# Patient Record
Sex: Female | Born: 1990 | Race: Black or African American | Hispanic: No | Marital: Single | Smoking: Former smoker
Health system: Southern US, Community
[De-identification: ages and names within clinical notes are randomized; demographics above are authoritative.]

## PROBLEM LIST (undated history)

## (undated) ENCOUNTER — Inpatient Hospital Stay (HOSPITAL_COMMUNITY): Payer: Self-pay

## (undated) DIAGNOSIS — I10 Essential (primary) hypertension: Secondary | ICD-10-CM

## (undated) DIAGNOSIS — E119 Type 2 diabetes mellitus without complications: Secondary | ICD-10-CM

## (undated) HISTORY — PX: NO PAST SURGERIES: SHX2092

---
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
## 2009-05-11 ENCOUNTER — Other Ambulatory Visit: Admission: RE | Admit: 2009-05-11 | Discharge: 2009-05-11 | Payer: Self-pay | Admitting: Family Medicine

## 2009-07-10 ENCOUNTER — Emergency Department (HOSPITAL_COMMUNITY): Admission: EM | Admit: 2009-07-10 | Discharge: 2009-07-10 | Payer: Self-pay | Admitting: Family Medicine

## 2010-07-15 ENCOUNTER — Emergency Department (HOSPITAL_COMMUNITY): Admission: EM | Admit: 2010-07-15 | Discharge: 2010-07-15 | Payer: Self-pay | Admitting: Family Medicine

## 2012-08-11 ENCOUNTER — Encounter (HOSPITAL_COMMUNITY): Payer: Self-pay | Admitting: Emergency Medicine

## 2012-08-11 ENCOUNTER — Emergency Department (HOSPITAL_COMMUNITY)
Admission: EM | Admit: 2012-08-11 | Discharge: 2012-08-11 | Disposition: A | Discharge status: Home / Self Care | Payer: 59 | Source: Home / Self Care | Attending: Family Medicine | Admitting: Family Medicine

## 2012-08-11 DIAGNOSIS — M25519 Pain in unspecified shoulder: Secondary | ICD-10-CM

## 2012-08-11 MED ORDER — NAPROXEN 500 MG PO TABS: 500.0000 mg | ORAL_TABLET | Freq: Two times a day (BID) | ORAL | Status: DC

## 2012-08-11 MED ORDER — CYCLOBENZAPRINE HCL 10 MG PO TABS: 10.0000 mg | ORAL_TABLET | Freq: Two times a day (BID) | ORAL | Status: DC | PRN

## 2012-08-11 NOTE — Discharge Instructions (Signed)
Shoulder Range of Motion Exercises  The shoulder is the most flexible joint in the human body. Because of this it is also the most unstable joint in the body. All ages can develop shoulder problems. Early treatment of problems is necessary for a good outcome. People react to shoulder pain by decreasing the movement of the joint. After a brief period of time, the shoulder can become "frozen". This is an almost complete loss of the ability to move the damaged shoulder. Following injuries your caregivers can give you instructions on exercises to keep your range of motion (ability to move your shoulder freely), or regain it if it has been lost.   EXERCISES  EXERCISES TO MAINTAIN THE MOBILITY OF YOUR SHOULDER:  Codman's Exercise or Pendulum Exercise  · This exercise may be performed in a prone (face-down) lying position or standing while leaning on a chair with the opposite arm. Its purpose is to relax the muscles in your shoulder and slowly but surely increase the range of motion and to relieve pain.  · Lie on your stomach close to the side edge of the bed. Let your weak arm hang over the edge of the bed. Relax your shoulder, arm and hand. Let your shoulder blade relax and drop down.  · Slowly and gently swing your arm forward and back. Do not use your neck muscles; relax them. It might be easier to have someone else gently start swinging your arm.  · As pain decreases, increase your swing. To start, arm swing should begin at 15 degree angles. In time and as pain lessens, move to 30-45 degree angles. Start with swinging for about 15 seconds, and work towards swinging for 3 to 5 minutes.  · This exercise may also be performed in a standing/bent over position.  · Stand and hold onto a sturdy chair with your good arm. Bend forward at the waist and bend your knees slightly to help protect your back. Relax your weak arm, let it hang limp. Relax your shoulder blade and let it drop.  · Keep your shoulder relaxed and use body  motion to swing your arm in small circles.  · Stand up tall and relax.  · Repeat motion and change direction of circles.  · Start with swinging for about 30 seconds, and work towards swinging for 3 to 5 minutes.  STRETCHING EXERCISES:  · Lift your arm out in front of you with the elbow bent at 90 degrees. Using your other arm gently pull the elbow forward and across your body.  · Bend one arm behind you with the palm facing outward. Using the other arm, hold a towel or rope and reach this arm up above your head, then bend it at the elbow to move your wrist to behind your neck. Grab the free end of the towel with the hand behind your back. Gently pull the towel up with the hand behind your neck, gradually increasing the pull on the hand behind the small of your back. Then, gradually pull down with the hand behind the small of your back. This will pull the hand and arm behind your neck further. Both shoulders will have an increased range of motion with repetition of this exercise.  STRENGTHENING EXERCISES:  · Standing with your arm at your side and straight out from your shoulder with the elbow bent at 90 degrees, hold onto a small weight and slowly raise your hand so it points straight up in the air. Repeat this five   times to begin with, and gradually increase to ten times. Do this four times per day. As you grow stronger you can gradually increase the weight.  · Repeat the above exercise, only this time using an elastic band. Start with your hand up in the air and pull down until your hand is by your side. As you grow stronger, gradually increase the amount you pull by increasing the number or size of the elastic bands. Use the same amount of repetitions.  · Standing with your hand at your side and holding onto a weight, gradually lift the hand in front of you until it is over your head. Do the same also with the hand remaining at your side and lift the hand away from your body until it is again over your head.  Repeat this five times to begin with, and gradually increase to ten times. Do this four times per day. As you grow stronger you can gradually increase the weight.  Document Released: 06/02/2003 Document Revised: 11/26/2011 Document Reviewed: 09/03/2005  [employer]® Patient Information ©2013 [employer].

## 2012-08-11 NOTE — ED Provider Notes (Signed)
History     CSN: [id number]  Arrival date & time 08/11/12  1924   First MD Initiated Contact with Patient 08/11/12 2037      Chief Complaint  Patient presents with  . Shoulder Pain    (Consider location/radiation/quality/duration/timing/severity/associated sxs/prior treatment) Patient is a 21 y.o. female presenting with shoulder pain. The history is provided by the patient.  Shoulder Pain This is a new problem.   Megan Ibarra is a 21 y.o. female who sustained a left shoulder injury 2 days ago. Mechanism of injury: fell coming out of her house, states she stepped down and lost her balance.  Immediate symptoms: immediate pain.  Pain is worse with movement; noted to be worse at night, denies change in arm function or strength.  Applied heat to area and took aleve with minimal relief.  Symptoms have been increasingly worse since that time shooting down arm, denies paresthesias. No prior history of related problems with shoulder.  Patient is not diabetic.  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    History reviewed. No pertinent past medical history.  History reviewed. No pertinent past surgical history.  No family history on file.  History  Substance Use Topics  . Smoking status: Never Smoker   . Smokeless tobacco: Not on file  . Alcohol Use: Yes    OB History    Grav Para Term Preterm Abortions TAB SAB Ect Mult Living                  Review of Systems  Musculoskeletal: Positive for arthralgias.  All other systems reviewed and are negative.    Allergies  Review of patient's allergies indicates no known allergies.  Home Medications   Current Outpatient Rx  Name  Route  Sig  Dispense  Refill  . ALEVE PO   Oral   Take by mouth.         . TROLAMINE SALICYLATE 10 % EX CREA   Topical   Apply topically as needed.         . CYCLOBENZAPRINE HCL 10 MG PO TABS   Oral   Take 1 tablet (10 mg total) by mouth 2 (two) times daily as needed for muscle spasms.   20  tablet   0   . NAPROXEN 500 MG PO TABS   Oral   Take 1 tablet (500 mg total) by mouth 2 (two) times daily.   30 tablet   0     BP 149/71  Pulse 87  Temp 99 F (37.2 C) (Oral)  Resp 16  SpO2 100%  LMP 08/06/2012  Physical Exam  Nursing note and vitals reviewed. Constitutional: She is oriented to person, place, and time. Vital signs are normal. She appears well-developed and well-nourished. She is active and cooperative.  HENT:  Head: Normocephalic.  Eyes: Conjunctivae normal are normal. Pupils are equal, round, and reactive to light. No scleral icterus.  Neck: Trachea normal and normal range of motion. Neck supple.  Cardiovascular: Normal rate, regular rhythm and normal heart sounds.   Pulmonary/Chest: Effort normal and breath sounds normal.  Musculoskeletal: Normal range of motion.       Right shoulder: Normal.       Left shoulder: Normal.       Arms: Lymphadenopathy:    She has no cervical adenopathy.  Neurological: She is alert and oriented to person, place, and time. She has normal strength. No cranial nerve deficit or sensory deficit. Coordination and gait normal. GCS eye subscore is  4. GCS verbal subscore is 5. GCS motor subscore is 6.  Skin: Skin is warm and dry.  Psychiatric: She has a normal mood and affect. Her speech is normal and behavior is normal. Judgment and thought content normal. Cognition and memory are normal.    ED Course  Procedures (including critical care time)  Labs Reviewed - No data to display No results found.   1. Shoulder pain       MDM  Heat therapy, nsaids and muscle relaxants.  No lifting for one week, stretching exercises.       Johnsie Kindred, NP 08/11/12 2046

## 2012-08-11 NOTE — ED Notes (Signed)
Left shoulder pain, pain radiates down left arm.  Patient reports she tripped and fell, landing on left side, landed in grassy area

## 2012-08-12 NOTE — ED Provider Notes (Signed)
Medical screening examination/treatment/procedure(s) were performed by resident physician or non-physician practitioner and as supervising physician I was immediately available for consultation/collaboration.   Barkley Bruns MD.    Linna Hoff, MD 08/12/12 1124

## 2013-02-11 ENCOUNTER — Other Ambulatory Visit: Payer: Self-pay | Admitting: Physician Assistant

## 2013-02-11 ENCOUNTER — Other Ambulatory Visit (HOSPITAL_COMMUNITY)
Admission: RE | Admit: 2013-02-11 | Discharge: 2013-02-11 | Disposition: A | Discharge status: Home / Self Care | Payer: 59 | Source: Ambulatory Visit | Attending: Family Medicine | Admitting: Family Medicine

## 2013-02-11 DIAGNOSIS — Z01419 Encounter for gynecological examination (general) (routine) without abnormal findings: Secondary | ICD-10-CM | POA: Insufficient documentation

## 2013-04-20 ENCOUNTER — Other Ambulatory Visit: Payer: Self-pay | Admitting: Family Medicine

## 2013-07-31 ENCOUNTER — Emergency Department (HOSPITAL_COMMUNITY)
Admission: EM | Admit: 2013-07-31 | Discharge: 2013-07-31 | Disposition: A | Discharge status: Home / Self Care | Payer: 59 | Source: Home / Self Care | Attending: Family Medicine | Admitting: Family Medicine

## 2013-07-31 ENCOUNTER — Encounter (HOSPITAL_COMMUNITY): Payer: Self-pay | Admitting: Emergency Medicine

## 2013-07-31 DIAGNOSIS — H6690 Otitis media, unspecified, unspecified ear: Secondary | ICD-10-CM

## 2013-07-31 HISTORY — DX: Type 2 diabetes mellitus without complications: E11.9

## 2013-07-31 MED ORDER — CHLORPHENIRAMINE-PSE-IBUPROFEN 2-30-200 MG PO TABS: ORAL_TABLET | ORAL | Status: DC

## 2013-07-31 MED ORDER — AMOXICILLIN-POT CLAVULANATE 875-125 MG PO TABS: 1.0000 | ORAL_TABLET | Freq: Two times a day (BID) | ORAL | Status: DC

## 2013-07-31 NOTE — ED Provider Notes (Signed)
Medical screening examination/treatment/procedure(s) were performed by resident physician or non-physician practitioner and as supervising physician I was immediately available for consultation/collaboration.   KINDL,JAMES DOUGLAS MD.   James D Kindl, MD 07/31/13 1116 

## 2013-07-31 NOTE — ED Notes (Signed)
Right ear pain x 1wk.  States not able to here out of right ear.   Some mild drainage.  Denies fever, n/v/d.    Pt has used otc ear drops with no relief.

## 2013-07-31 NOTE — ED Provider Notes (Signed)
CSN: [id number]     Arrival date & time 07/31/13  0957 History   First MD Initiated Contact with Patient 07/31/13 1043     Chief Complaint  Patient presents with  . Otalgia   (Consider location/radiation/quality/duration/timing/severity/associated sxs/prior Treatment) HPI Comments: 22 year old female presents complaining of right ear pain with some drainage and decreased hearing. This is been going on for about 2 weeks total. When this first began, she had pain all over the right side of her face around the ear. This has resolved, but the pain persists. She's had some brown drainage from the ear and her hearing is very muffled in the right ear. She denies any other symptoms including no fever, chills, NVD, rash. She has a history of ear infections as a small child but nothing since then.  Patient is a 22 y.o. female presenting with ear pain.  Otalgia Associated symptoms: hearing loss   Associated symptoms: no abdominal pain, no cough, no fever, no rash and no vomiting     Past Medical History  Diagnosis Date  . Diabetes mellitus without complication    History reviewed. No pertinent past surgical history. History reviewed. No pertinent family history. History  Substance Use Topics  . Smoking status: Never Smoker   . Smokeless tobacco: Not on file  . Alcohol Use: Yes   OB History   Grav Para Term Preterm Abortions TAB SAB Ect Mult Living                 Review of Systems  Constitutional: Negative for fever and chills.  HENT: Positive for ear pain and hearing loss.   Eyes: Negative for visual disturbance.  Respiratory: Negative for cough and shortness of breath.   Cardiovascular: Negative for chest pain, palpitations and leg swelling.  Gastrointestinal: Negative for nausea, vomiting and abdominal pain.  Endocrine: Negative for polydipsia and polyuria.  Genitourinary: Negative for dysuria, urgency and frequency.  Musculoskeletal: Negative for arthralgias and myalgias.  Skin:  Negative for rash.  Neurological: Negative for dizziness, weakness and light-headedness.    Allergies  Review of patient's allergies indicates no known allergies.  Home Medications   Current Outpatient Rx  Name  Route  Sig  Dispense  Refill  . amoxicillin-clavulanate (AUGMENTIN) 875-125 MG per tablet   Oral   Take 1 tablet by mouth every 12 (twelve) hours.   20 tablet   0   . Chlorpheniramine-PSE-Ibuprofen (ADVIL ALLERGY SINUS) 2-30-200 MG TABS      1-2 tabs PO Q4-6 hrs PRN   50 each   1   . cyclobenzaprine (FLEXERIL) 10 MG tablet   Oral   Take 1 tablet (10 mg total) by mouth 2 (two) times daily as needed for muscle spasms.   20 tablet   0   . METFORMIN HCL PO   Oral   Take by mouth.         . naproxen (NAPROSYN) 500 MG tablet   Oral   Take 1 tablet (500 mg total) by mouth 2 (two) times daily.   30 tablet   0   . Naproxen Sodium (ALEVE PO)   Oral   Take by mouth.         . trolamine salicylate (ASPERCREME) 10 % cream   Topical   Apply topically as needed.          BP 148/87  Pulse 95  Temp(Src) 98.4 F (36.9 C) (Oral)  Resp 16  SpO2 100%  LMP 07/25/2013 Physical Exam  Nursing  note and vitals reviewed. Constitutional: She is oriented to person, place, and time. Vital signs are normal. She appears well-developed and well-nourished. No distress.  HENT:  Head: Normocephalic and atraumatic.  Right Ear: There is drainage and tenderness. Tympanic membrane is perforated, erythematous and retracted. Decreased hearing is noted.  Left Ear: Hearing, tympanic membrane, external ear and ear canal normal.  Pulmonary/Chest: Effort normal. No respiratory distress.  Neurological: She is alert and oriented to person, place, and time. She has normal strength. Coordination normal.  Skin: Skin is warm and dry. No rash noted. She is not diaphoretic.  Psychiatric: She has a normal mood and affect. Judgment normal.    ED Course  Procedures (including critical care  time) Labs Review Labs Reviewed - No data to display Imaging Review No results found.    MDM   1. AOM (acute otitis media), right    Acute otitis media of the right, appears to have perforated and is healing but there still appears to be infection behind the TM. Treating with Augmentin, followup in 10 days here or with primary care physician for recheck.   Meds ordered this encounter  Medications  . amoxicillin-clavulanate (AUGMENTIN) 875-125 MG per tablet    Sig: Take 1 tablet by mouth every 12 (twelve) hours.    Dispense:  20 tablet    Refill:  0    Order Specific Question:  Supervising Provider    Answer:  Lorenz Coaster, DAVID C [id number]  . Chlorpheniramine-PSE-Ibuprofen (ADVIL ALLERGY SINUS) 2-30-200 MG TABS    Sig: 1-2 tabs PO Q4-6 hrs PRN    Dispense:  50 each    Refill:  1    Order Specific Question:  Supervising Provider    Answer:  Lorenz Coaster, DAVID C [[id number]]       Graylon Good, PA-C 07/31/13 1055

## 2015-01-13 ENCOUNTER — Other Ambulatory Visit: Payer: Self-pay | Admitting: Family Medicine

## 2015-01-13 ENCOUNTER — Other Ambulatory Visit (HOSPITAL_COMMUNITY)
Admission: RE | Admit: 2015-01-13 | Discharge: 2015-01-13 | Disposition: A | Discharge status: Home / Self Care | Payer: 59 | Source: Ambulatory Visit | Attending: Family Medicine | Admitting: Family Medicine

## 2015-01-13 DIAGNOSIS — Z113 Encounter for screening for infections with a predominantly sexual mode of transmission: Secondary | ICD-10-CM | POA: Diagnosis present

## 2015-01-13 DIAGNOSIS — Z01419 Encounter for gynecological examination (general) (routine) without abnormal findings: Secondary | ICD-10-CM | POA: Diagnosis present

## 2015-01-13 DIAGNOSIS — N870 Mild cervical dysplasia: Secondary | ICD-10-CM | POA: Diagnosis present

## 2015-01-13 DIAGNOSIS — N760 Acute vaginitis: Secondary | ICD-10-CM | POA: Diagnosis present

## 2015-01-14 LAB — CYTOLOGY - PAP

## 2016-05-30 ENCOUNTER — Ambulatory Visit (HOSPITAL_COMMUNITY)
Admission: EM | Admit: 2016-05-30 | Discharge: 2016-05-30 | Disposition: A | Discharge status: Home / Self Care | Payer: 59 | Attending: Internal Medicine | Admitting: Internal Medicine

## 2016-05-30 ENCOUNTER — Encounter (HOSPITAL_COMMUNITY): Payer: Self-pay | Admitting: Emergency Medicine

## 2016-05-30 DIAGNOSIS — M25511 Pain in right shoulder: Secondary | ICD-10-CM

## 2016-05-30 MED ORDER — DICLOFENAC SODIUM 75 MG PO TBEC: 75.0000 mg | DELAYED_RELEASE_TABLET | Freq: Two times a day (BID) | ORAL | 0 refills | Status: DC
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
## 2016-07-05 ENCOUNTER — Ambulatory Visit (HOSPITAL_COMMUNITY)
Admission: EM | Admit: 2016-07-05 | Discharge: 2016-07-05 | Disposition: A | Discharge status: Home / Self Care | Payer: Self-pay | Attending: Family Medicine | Admitting: Family Medicine

## 2016-07-05 ENCOUNTER — Encounter (HOSPITAL_COMMUNITY): Payer: Self-pay | Admitting: Emergency Medicine

## 2016-07-05 DIAGNOSIS — J069 Acute upper respiratory infection, unspecified: Secondary | ICD-10-CM

## 2016-07-05 MED ORDER — IPRATROPIUM BROMIDE 0.06 % NA SOLN: 2.0000 | Freq: Four times a day (QID) | NASAL | 12 refills | Status: DC

## 2016-07-05 MED ORDER — HYDROCOD POLST-CPM POLST ER 10-8 MG/5ML PO SUER: 5.0000 mL | Freq: Two times a day (BID) | ORAL | 0 refills | Status: DC | PRN

## 2016-07-05 MED ORDER — IPRATROPIUM BROMIDE 0.06 % NA SOLN: 2.0000 | Freq: Four times a day (QID) | NASAL | 1 refills | Status: DC

## 2016-07-05 NOTE — ED Triage Notes (Signed)
Patient complains of congestion and bad cough.  Onset of symptoms 10/18

## 2016-07-05 NOTE — ED Provider Notes (Signed)
[facility]    CSN: [id number] Arrival date & time: 07/05/16  1842     History   Chief Complaint No chief complaint on file.   HPI Megan Ibarra is a 25 y.o. female.    URI  Presenting symptoms: congestion, cough, rhinorrhea and sore throat   Presenting symptoms: no fever   Severity:  Mild Onset quality:  Gradual Duration:  2 days Progression:  Unchanged Chronicity:  New Relieved by:  Nothing Worsened by:  Nothing Ineffective treatments:  None tried Risk factors: no sick contacts     Past Medical History:  Diagnosis Date  . Diabetes mellitus without complication (HCC)     There are no active problems to display for this patient.   No past surgical history on file.  OB History    No data available       Home Medications    Prior to Admission medications   Medication Sig Start Date End Date Taking? Authorizing Provider  amoxicillin-clavulanate (AUGMENTIN) 875-125 MG per tablet Take 1 tablet by mouth every 12 (twelve) hours. 07/31/13   Graylon GoodZachary H Baker, PA-C  Chlorpheniramine-PSE-Ibuprofen (ADVIL ALLERGY SINUS) 2-30-200 MG TABS 1-2 tabs PO Q4-6 hrs PRN 07/31/13   Graylon GoodZachary H Baker, PA-C  cyclobenzaprine (FLEXERIL) 10 MG tablet Take 1 tablet (10 mg total) by mouth 2 (two) times daily as needed for muscle spasms. 08/11/12   Johnsie Kindredarmen L Chatten, NP  diclofenac (VOLTAREN) 75 MG EC tablet Take 1 tablet (75 mg total) by mouth 2 (two) times daily. 05/30/16   Tharon AquasFrank C Patrick, PA  METFORMIN HCL PO Take by mouth.    Historical Provider, MD  naproxen (NAPROSYN) 500 MG tablet Take 1 tablet (500 mg total) by mouth 2 (two) times daily. 08/11/12   Johnsie Kindredarmen L Chatten, NP  Naproxen Sodium (ALEVE PO) Take by mouth.    Historical Provider, MD  trolamine salicylate (ASPERCREME) 10 % cream Apply topically as needed.    Historical Provider, MD    Family History No family history on file.  Social History Social History  Substance Use Topics  . Smoking status: Never  Smoker  . Smokeless tobacco: Never Used  . Alcohol use Yes     Allergies   Review of patient's allergies indicates no known allergies.   Review of Systems Review of Systems  Constitutional: Negative.  Negative for fever.  HENT: Positive for congestion, postnasal drip, rhinorrhea and sore throat.   Respiratory: Positive for cough. Negative for shortness of breath.   Cardiovascular: Negative.   Gastrointestinal: Negative.   Neurological: Negative.   All other systems reviewed and are negative.    Physical Exam Triage Vital Signs ED Triage Vitals [07/05/16 1907]  Enc Vitals Group     BP (!) 159/103     Pulse Rate 105     Resp 16     Temp 99.1 F (37.3 C)     Temp Source Oral     SpO2 99 %     Weight      Height      Head Circumference      Peak Flow      Pain Score      Pain Loc      Pain Edu?      Excl. in GC?    No data found.   Updated Vital Signs BP (!) 159/103 (BP Location: Left Wrist) Comment: notified rn  Pulse 105   Temp 99.1 F (37.3 C) (Oral)   Resp 16  SpO2 99%   Visual Acuity Right Eye Distance:   Left Eye Distance:   Bilateral Distance:    Right Eye Near:   Left Eye Near:    Bilateral Near:     Physical Exam  Constitutional: She appears well-developed and well-nourished. No distress.  HENT:  Right Ear: External ear normal.  Left Ear: External ear normal.  Nose: Nose normal.  Mouth/Throat: Oropharynx is clear and moist.  Neck: Normal range of motion. Neck supple.  Cardiovascular: Normal rate, regular rhythm, normal heart sounds and intact distal pulses.   Pulmonary/Chest: Effort normal and breath sounds normal. She has no wheezes. She has no rales.  Lymphadenopathy:    She has no cervical adenopathy.  Skin: Skin is warm and dry.  Nursing note and vitals reviewed.    UC Treatments / Results  Labs (all labs ordered are listed, but only abnormal results are displayed) Labs Reviewed - No data to display  EKG  EKG  Interpretation None       Radiology No results found.  Procedures Procedures (including critical care time)  Medications Ordered in UC Medications - No data to display   Initial Impression / Assessment and Plan / UC Course  I have reviewed the triage vital signs and the nursing notes.  Pertinent labs & imaging results that were available during my care of the patient were reviewed by me and considered in my medical decision making (see chart for details).  Clinical Course      Final Clinical Impressions(s) / UC Diagnoses   Final diagnoses:  None    New Prescriptions New Prescriptions   No medications on file     Linna Hoff, MD 07/05/16 1934

## 2016-09-17 NOTE — L&D Delivery Note (Signed)
Delivery Note At 8:51 PM, on Sept 22, 2018, a viable female "Megan Ibarra" was delivered via Vaginal, Spontaneous Delivery (Presentation:Left Occiput Anterior ).  Shoulders delivered easily and infant with good tone and spontaneous cry. Tactile stimulation given by provider and infant placed on mother's abdomen where nurse continued tactile stimulation. Infant APGAR: 8, 9. Cord clamped, cut, and blood collected. Placenta delivered spontaneously and noted to be intact with 3VC upon inspection.  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  Vaginal inspection revealed a left side sulcus laceration that was repaired with 3-0 vicryl on CT-1. Lidocaine 1% local anesthetic necessary and patient tolerated the procedure well. Fundus firm, at the umbilicus, and bleeding small.  Mother hemodynamically stable and infant skin to skin prior to provider exit.  Mother desires birth control, but is unsure of method, and opts to breastfeed.  Family wishes for infant to be circumcised during inpatient stay.  Infant weight at one hour of life: 6lbs 13.5 oz, 19.5 in  Anesthesia:  Epidural and Local for Repair Episiotomy: None Lacerations: Sulcus-Left Side Suture Repair: 3.0 vicryl Est. Blood Loss (mL):  150  Mom to postpartum.  Baby to Couplet care / Skin to Skin. CBG prior to transfer Okay to discontinue glucometer Will continue oral labetalol and procardia Fasting CBG in AM  Cherre Robins MSN, CNM 06/08/2017, 9:27 PM

## 2016-10-07 ENCOUNTER — Inpatient Hospital Stay (HOSPITAL_COMMUNITY)
Admission: AD | Admit: 2016-10-07 | Discharge: 2016-10-07 | Disposition: A | Discharge status: Home / Self Care | Payer: Self-pay | Source: Ambulatory Visit | Attending: Obstetrics & Gynecology | Admitting: Obstetrics & Gynecology

## 2016-10-07 DIAGNOSIS — Z79899 Other long term (current) drug therapy: Secondary | ICD-10-CM | POA: Insufficient documentation

## 2016-10-07 DIAGNOSIS — E1110 Type 2 diabetes mellitus with ketoacidosis without coma: Secondary | ICD-10-CM

## 2016-10-07 DIAGNOSIS — E1310 Other specified diabetes mellitus with ketoacidosis without coma: Secondary | ICD-10-CM

## 2016-10-07 DIAGNOSIS — Z3A00 Weeks of gestation of pregnancy not specified: Secondary | ICD-10-CM | POA: Insufficient documentation

## 2016-10-07 DIAGNOSIS — O24919 Unspecified diabetes mellitus in pregnancy, unspecified trimester: Secondary | ICD-10-CM | POA: Insufficient documentation

## 2016-10-07 DIAGNOSIS — R030 Elevated blood-pressure reading, without diagnosis of hypertension: Secondary | ICD-10-CM | POA: Insufficient documentation

## 2016-10-07 DIAGNOSIS — O26899 Other specified pregnancy related conditions, unspecified trimester: Secondary | ICD-10-CM | POA: Insufficient documentation

## 2016-10-07 DIAGNOSIS — O10019 Pre-existing essential hypertension complicating pregnancy, unspecified trimester: Secondary | ICD-10-CM

## 2016-10-07 MED ORDER — METFORMIN HCL 500 MG PO TABS: 500.0000 mg | ORAL_TABLET | Freq: Two times a day (BID) | ORAL | 5 refills | Status: DC

## 2016-10-07 MED ORDER — PRENATAL VITAMINS 0.8 MG PO TABS: 1.0000 | ORAL_TABLET | Freq: Every day | ORAL | 12 refills | Status: DC

## 2016-10-07 MED ORDER — LABETALOL HCL 200 MG PO TABS: 200.0000 mg | ORAL_TABLET | Freq: Two times a day (BID) | ORAL | 9 refills | Status: DC

## 2016-10-07 NOTE — Discharge Instructions (Signed)
Please check your blood sugar 4 times daily (fasting and 2 hours after each meal). Fasting values should be less than 90 and after meal values should be less than 120  Type 1 or Type 2 Diabetes Mellitus During Pregnancy, Self Care Caring for yourself during your pregnancy when you have type 1 diabetes (type 1 diabetes mellitus) or type 2 diabetes (type 2 diabetes mellitus) means keeping your blood sugar (glucose) under control with a balance of:  Nutrition.  Exercise.  Lifestyle changes.  Insulin or medicines, if necessary.  Support from your team of health care providers and others. The following information explains what you need to know to manage your diabetes at home during your pregnancy. What do I need to do to manage my blood glucose?  Check your blood glucose every day, as often as told by your health care provider.  Contact your health care provider if your blood glucose is above your target for 2 tests in a row.  Have your A1c (hemoglobin A1c) level checked at least two times a year, or as often as told by your health care provider. Your health care provider will set individualized treatment goals for you. Generally, the goal of treatment is to maintain the following blood glucose levels during pregnancy:  After not eating (after fasting) for 8 hours: at or below 95 mg/dL (5.3 mmol/L).  After meals (postprandial):  One hour after a meal: at or below 140 mg/dL (7.8 mmol/L).  Two hours after a meal: at or below 120 mg/dL (6.7 mmol/L).  A1c level: 6-6.5% What do I need to know about hyperglycemia and hypoglycemia? What is hyperglycemia? Hyperglycemia, also called high blood glucose, occurs when blood glucose is too high. Make sure you know the early signs of hyperglycemia, such as:  Increased thirst.  Hunger.  Feeling very tired.  Needing to urinate more often than usual.  Blurry vision. What is hypoglycemia?  Hypoglycemia, also called low blood glucose, occurs  with a blood glucose level at or below 70 mg/dL (3.9 mmol/L). The risk for hypoglycemia increases during or after exercise, during sleep, during illness, and when skipping meals or fasting. It is important to know the symptoms of hypoglycemia and treat it right away. Always have a 15-gram rapid-acting carbohydrate snack with you to treat low blood glucose. Family members and close friends should also know the symptoms and should understand how to treat hypoglycemia, in case you are not able to treat yourself. What are the symptoms of hypoglycemia?  Hypoglycemia symptoms can include:  Hunger.  Anxiety.  Sweating and feeling clammy.  Confusion.  Dizziness or feeling light-headed.  Sleepiness.  Nausea.  Increased heart rate.  Headache.  Blurry vision.  Seizure.  Nightmares.  Tingling or numbness around the mouth, lips, or tongue.  A change in speech.  Decreased ability to concentrate.  A change in coordination.  Restless sleep.  Tremors or shakes.  Fainting.  Irritability. How do I treat hypoglycemia?  If you are alert and able to swallow safely, follow the 15:15 rule:  Take 15 grams of a rapid-acting carbohydrate. Rapid-acting options include:  1 tube of glucose gel.  3 glucose pills.  6-8 pieces of hard candy.  4 oz (120 mL) of fruit juice .  4 oz (120 mL) of regular (not diet) soda.  Check your blood glucose 15 minutes after you take the carbohydrate.  If the repeat blood glucose level is still at or below 70 mg/dL (3.9 mmol/L), take 15 grams of a  carbohydrate again.  If your blood glucose level does not increase above 70 mg/dL (3.9 mmol/L) after 3 tries, seek emergency medical care.  After your blood glucose level returns to normal, eat a meal or a snack within 1 hour. How do I treat severe hypoglycemia?  Severe hypoglycemia is when your blood glucose level is at or below 54 mg/dL (3 mmol/L). Severe hypoglycemia is an emergency. Do not wait to see  if the symptoms will go away. Get medical help right away. Call your local emergency services (911 in the [place] Do not drive yourself to the hospital. If you have severe hypoglycemia and you cannot eat or drink, you may need an injection of glucagon. A family member or close friend should learn how to check your blood glucose and how to give you a glucagon injection. Ask your health care provider if you need to have an emergency glucagon injection kit available. Severe hypoglycemia may need to be treated in a hospital. The treatment may include getting glucose through an IV tube. You may also need treatment for the cause of your hypoglycemia. Can having diabetes put me at risk for other conditions? Having diabetes can put you at risk for other long-term (chronic) conditions, such as heart disease and kidney disease. Your health care provider may prescribe medicines to help prevent complications from diabetes. These medicines may include:  Aspirin.  Medicine to lower cholesterol.  Medicine to control blood pressure. What else can I do to manage my diabetes? Take your diabetes medicines as told  If your health care provider prescribed insulin or diabetes medicines, take them every day.  Do not run out of insulin or other diabetes medicines that you take. Plan ahead so you always have these available.  If you use insulin, adjust your dosage based on how physically active you are and what foods you eat. Your health care provider will tell you how to adjust your dosage. Your health care provider may recommend that you take one low-dose aspirin (81 mg) each day to help prevent high blood pressure during pregnancy (preeclampsia or eclampsia). You may be at risk for preeclampsia or eclampsia if:  You had any of the following during a previous pregnancy:  Preeclampsia or eclampsia.  A fetal growth rate that was slower than normal.  An early (preterm) birth.  Separation of the placenta from the  uterus (placental abruption).  Fetal loss.  You are pregnant with more than one baby.  You have other medical conditions, such as high blood pressure or an autoimmune disease. Make healthy food choices  The things that you eat and drink affect your blood glucose and your insulin dosage. Making good choices helps to control your diabetes and prevent other health problems. A healthy meal plan includes eating lean proteins, complex carbohydrates, fresh fruits and vegetables, low-fat dairy products, and healthy fats. Make an appointment to see a diet and nutrition specialist (registered dietitian) to help you create an eating plan that is right for you. Make sure that you:  Follow instructions from your health care provider about eating or drinking restrictions.  Drink enough fluid to keep your urine clear or pale yellow.  Eat healthy snacks between nutritious meals.  Track the carbohydrates that you eat. Do this by reading food labels and learning the standard serving sizes of foods.  Follow your sick day plan whenever you cannot eat or drink as usual. Make this plan in advance with your health care provider. Stay active   Do  at least 30 minutes of physical activity a day, or as much physical activity as your health care provider recommends during your pregnancy.  If you start a new exercise or activity, work with your health care provider to adjust your insulin, medicines, or food intake as needed. Make healthy lifestyle choices  Do not use any tobacco products, such as cigarettes, chewing tobacco, and e-cigarettes. If you need help quitting, ask your health care provider.  Do not use alcohol.  Learn to manage stress. If you need help with this, ask your health care provider. Care for your body  Keep your immunizations up to date.  Schedule an eye exam during your first trimester of your pregnancy, or as told by your health care provider.  Check your skin and feet every day for  cuts, bruises, redness, blisters, or sores. Schedule a foot exam with your health care provider once every year.  Brush your teeth and gums two times a day, and floss at least one time a day. Visit your dentist at least once every 6 months.  Maintain a healthy weight during your pregnancy. General instructions   Take over-the-counter and prescription medicines only as told by your health care provider.  Talk with your health care provider about your risk for high blood pressure during pregnancy (preeclampsia or eclampsia).  Share your diabetes management plan with people in your workplace, school, and household.  Check your urine ketones when you are ill and as told by your health care provider.  Carry a medical alert card or wear medical alert jewelry.  Ask your health care provider:  Do I need to meet with a diabetes educator?  Where can I find a support group for people with diabetes?  Keep all follow-up visits during your pregnancy (prenatal) and after delivery (postnatal) as told by your health care provider. This is important. Where to find more information: For more information about diabetes, visit:  American Diabetes Association (ADA): www.diabetes.org  American Association of Diabetes Educators (AADE): https://www.diabeteseducator.org/patient-resources This information is not intended to replace advice given to you by your health care provider. Make sure you discuss any questions you have with your health care provider. Document Released: 12/26/2015 Document Revised: 02/09/2016 Document Reviewed: 10/07/2015 Elsevier Interactive Patient Education  2017 [employer].  Hypertension During Pregnancy Hypertension, commonly called high blood pressure, is when the force of blood pumping through your arteries is too strong. Arteries are blood vessels that carry blood from the heart throughout the body. Hypertension during pregnancy can cause problems for you and your baby. Your  baby may be born early (prematurely) or may not weigh as much as he or she should at birth. Very bad cases of hypertension during pregnancy can be life-threatening. Different types of hypertension can occur during pregnancy. These include:  Chronic hypertension. This happens when:  You have hypertension before pregnancy and it continues during pregnancy.  You develop hypertension before you are [redacted] weeks pregnant, and it continues during pregnancy.  Gestational hypertension. This is hypertension that develops after the 20th week of pregnancy.  Preeclampsia, also called toxemia of pregnancy. This is a very serious type of hypertension that develops only during pregnancy. It affects the whole body, and it can be very dangerous for you and your baby. Gestational hypertension and preeclampsia usually go away within 6 weeks after your baby is born. Women who have hypertension during pregnancy have a greater chance of developing hypertension later in life or during future pregnancies. What are the causes?  The exact cause of hypertension is not known. What increases the risk? There are certain factors that make it more likely for you to develop hypertension during pregnancy. These include:  Having hypertension during a previous pregnancy or prior to pregnancy.  Being overweight.  Being older than age 52.  Being pregnant for the first time or being pregnant with more than one baby.  Becoming pregnant using fertilization methods such as IVF (in vitro fertilization).  Having diabetes, kidney problems, or systemic lupus erythematosus.  Having a family history of hypertension. What are the signs or symptoms? Chronic hypertension and gestational hypertension rarely cause symptoms. Preeclampsia causes symptoms, which may include:  Increased protein in your urine. Your health care provider will check for this at every visit before you give birth (prenatal visit).  Severe headaches.  Sudden  weight gain.  Swelling of the hands, face, legs, and feet.  Nausea and vomiting.  Vision problems, such as blurred or double vision.  Numbness in the face, arms, legs, and feet.  Dizziness.  Slurred speech.  Sensitivity to bright lights.  Abdominal pain.  Convulsions. How is this diagnosed? You may be diagnosed with hypertension during a routine prenatal exam. At each prenatal visit, you may:  Have a urine test to check for high amounts of protein in your urine.  Have your blood pressure checked. A blood pressure reading is recorded as two numbers, such as "120 over 80" (or 120/80). The first ("top") number is called the systolic pressure. It is a measure of the pressure in your arteries when your heart beats. The second ("bottom") number is called the diastolic pressure. It is a measure of the pressure in your arteries as your heart relaxes between beats. Blood pressure is measured in a unit called mm Hg. A normal blood pressure reading is:  Systolic: below 161.  Diastolic: below 80. The type of hypertension that you are diagnosed with depends on your test results and when your symptoms developed.  Chronic hypertension is usually diagnosed before 20 weeks of pregnancy.  Gestational hypertension is usually diagnosed after 20 weeks of pregnancy.  Hypertension with high amounts of protein in the urine is diagnosed as preeclampsia.  Blood pressure measurements that stay above 096 systolic, or above 045 diastolic, are signs of severe preeclampsia. How is this treated? Treatment for hypertension during pregnancy varies depending on the type of hypertension you have and how serious it is.  If you take medicines called ACE inhibitors to treat chronic hypertension, you may need to switch medicines. ACE inhibitors should not be taken during pregnancy.  If you have gestational hypertension, you may need to take blood pressure medicine.  If you are at risk for preeclampsia, your  health care provider may recommend that you take a low-dose aspirin every day to prevent high blood pressure during your pregnancy.  If you have severe preeclampsia, you may need to be hospitalized so you and your baby can be monitored closely. You may also need to take medicine (magnesium sulfate) to prevent seizures and to lower blood pressure. This medicine may be given as an injection or through an IV tube.  In some cases, if your condition gets worse, you may need to deliver your baby early. Follow these instructions at home: Eating and drinking  Drink enough fluid to keep your urine clear or pale yellow.  Eat a healthy diet that is low in salt (sodium). Do not add salt to your food. Check food labels to see how much  sodium a food or beverage contains. Lifestyle  Do not use any products that contain nicotine or tobacco, such as cigarettes and e-cigarettes. If you need help quitting, ask your health care provider.  Do not use alcohol.  Avoid caffeine.  Avoid stress as much as possible. Rest and get plenty of sleep. General instructions  Take over-the-counter and prescription medicines only as told by your health care provider.  While lying down, lie on your left side. This keeps pressure off your baby.  While sitting or lying down, raise (elevate) your feet. Try putting some pillows under your lower legs.  Exercise regularly. Ask your health care provider what kinds of exercise are best for you.  Keep all prenatal and follow-up visits as told by your health care provider. This is important. Contact a health care provider if:  You have symptoms that your health care provider told you may require more treatment or monitoring, such as:  Fever.  Vomiting.  Headache. Get help right away if:  You have severe abdominal pain or vomiting that does not get better with treatment.  You suddenly develop swelling in your hands, ankles, or face.  You gain 4 lbs (1.8 kg) or more in  1 week.  You develop vaginal bleeding, or you have blood in your urine.  You do not feel your baby moving as much as usual.  You have blurred or double vision.  You have muscle twitching or sudden tightening (spasms).  You have shortness of breath.  Your lips or fingernails turn blue. This information is not intended to replace advice given to you by your health care provider. Make sure you discuss any questions you have with your health care provider. Document Released: 05/22/2011 Document Revised: 03/23/2016 Document Reviewed: 02/17/2016 Elsevier Interactive Patient Education  2017 [employer].

## 2016-10-07 NOTE — MAU Note (Signed)
Pt presents to MAU for pregnancy confirmation. Denies any vaginal bleeding or pain

## 2016-10-07 NOTE — MAU Note (Signed)
Pt advised by Dr. Jolayne Pantheronstant to return to clinic tomorrow for pregnancy verification. Rx given for b/p and diabetes.

## 2016-10-07 NOTE — MAU Provider Note (Signed)
  History     CSN: [id number]  Arrival date and time: 10/07/16 1438   None     Chief Complaint  Patient presents with  . pregnancy confirmation   HPI  26 yo presenting today for pregnancy verification letter. Patient reports feeling well with positive home pregnancy test on Friday. She denies any cramping or vaginal bleeding. Patient reports being a poorly controlled diabetic. She has not been taking her metformin as instructed for months secondary to GI side effects.  Past Medical History:  Diagnosis Date  . Diabetes mellitus without complication (HCC)     No past surgical history on file.  No family history on file.  Social History  Substance Use Topics  . Smoking status: Never Smoker  . Smokeless tobacco: Never Used  . Alcohol use Yes    Allergies: No Known Allergies  Prescriptions Prior to Admission  Medication Sig Dispense Refill Last Dose  . amoxicillin-clavulanate (AUGMENTIN) 875-125 MG per tablet Take 1 tablet by mouth every 12 (twelve) hours. 20 tablet 0 Unknown at Unknown time  . chlorpheniramine-HYDROcodone (TUSSIONEX PENNKINETIC ER) 10-8 MG/5ML SUER Take 5 mLs by mouth every 12 (twelve) hours as needed for cough. 140 mL 0   . Chlorpheniramine-PSE-Ibuprofen (ADVIL ALLERGY SINUS) 2-30-200 MG TABS 1-2 tabs PO Q4-6 hrs PRN 50 each 1 Unknown at Unknown time  . cyclobenzaprine (FLEXERIL) 10 MG tablet Take 1 tablet (10 mg total) by mouth 2 (two) times daily as needed for muscle spasms. 20 tablet 0 Unknown at Unknown time  . diclofenac (VOLTAREN) 75 MG EC tablet Take 1 tablet (75 mg total) by mouth 2 (two) times daily. 30 tablet 0 Unknown at Unknown time  . ipratropium (ATROVENT) 0.06 % nasal spray Place 2 sprays into the nose 4 (four) times daily. 15 mL 12   . ipratropium (ATROVENT) 0.06 % nasal spray Place 2 sprays into both nostrils 4 (four) times daily. 15 mL 1   . METFORMIN HCL PO Take by mouth.   Unknown at Unknown time  . naproxen (NAPROSYN) 500 MG tablet Take 1  tablet (500 mg total) by mouth 2 (two) times daily. 30 tablet 0 Unknown at Unknown time  . Naproxen Sodium (ALEVE PO) Take by mouth.   Unknown at Unknown time  . trolamine salicylate (ASPERCREME) 10 % cream Apply topically as needed.   Unknown at Unknown time    Review of Systems  See pertinent in HPI Physical Exam   Blood pressure (!) 164/101, pulse 105, resp. rate 18, height 5\' 4"  (1.626 m), weight 238 lb (108 kg), last menstrual period 08/31/2016.  Physical Exam GENERAL: Well-developed, well-nourished female in no acute distress.  NEURO: alert and oriented x 3  MAU Course  Procedures    Assessment and Plan  26 yo here for pregnancy verification letter - Informed patient that letter cannot be provided in MAU and she should contact [facility] for appointment for pregnancy test and letter - Patient noted to be hypertensive. She reports being told she has high blood pressure a few months ago. She is not taking any medications. Rx labetalol 200 mg BID provided - Information on DM in pregnancy provided. Advised the patient to restart metformin Rx 500 mg BID provided. It was explained to the patient that the dosage may be increased or medication changed when she starts prenatal care. Advised patient to start monitoring CBGs 4 times daily (target CBG values reviewed as well) - Rx prenatal vitamins provided  Delena Casebeer 10/07/2016, 2:56 PM

## 2016-10-09 ENCOUNTER — Emergency Department (HOSPITAL_COMMUNITY): Admission: EM | Admit: 2016-10-09 | Discharge: 2016-10-09 | Discharge status: Against Medical Advice | Payer: Self-pay

## 2016-10-09 NOTE — ED Notes (Signed)
Pt left pre-triage 

## 2016-11-12 LAB — OB RESULTS CONSOLE ANTIBODY SCREEN: Antibody Screen: NEGATIVE

## 2016-11-12 LAB — OB RESULTS CONSOLE ABO/RH: RH Type: POSITIVE

## 2016-11-12 LAB — OB RESULTS CONSOLE HIV ANTIBODY (ROUTINE TESTING): HIV: NONREACTIVE

## 2016-11-12 LAB — OB RESULTS CONSOLE RUBELLA ANTIBODY, IGM: RUBELLA: IMMUNE

## 2016-11-12 LAB — OB RESULTS CONSOLE HEPATITIS B SURFACE ANTIGEN: Hepatitis B Surface Ag: NEGATIVE

## 2016-11-12 LAB — OB RESULTS CONSOLE RPR: RPR: NONREACTIVE

## 2016-11-12 LAB — OB RESULTS CONSOLE GC/CHLAMYDIA
Chlamydia: NEGATIVE
GC PROBE AMP, GENITAL: NEGATIVE

## 2016-11-26 ENCOUNTER — Other Ambulatory Visit (HOSPITAL_COMMUNITY): Payer: Self-pay | Admitting: Obstetrics and Gynecology

## 2016-11-26 ENCOUNTER — Other Ambulatory Visit (HOSPITAL_COMMUNITY)
Admission: RE | Admit: 2016-11-26 | Discharge: 2016-11-26 | Disposition: A | Discharge status: Home / Self Care | Payer: Medicaid Other | Source: Ambulatory Visit | Attending: Obstetrics and Gynecology | Admitting: Obstetrics and Gynecology

## 2016-11-26 ENCOUNTER — Other Ambulatory Visit: Payer: Self-pay | Admitting: Obstetrics and Gynecology

## 2016-11-26 DIAGNOSIS — Z113 Encounter for screening for infections with a predominantly sexual mode of transmission: Secondary | ICD-10-CM | POA: Insufficient documentation

## 2016-11-26 DIAGNOSIS — IMO0002 Reserved for concepts with insufficient information to code with codable children: Secondary | ICD-10-CM

## 2016-11-26 DIAGNOSIS — Z01419 Encounter for gynecological examination (general) (routine) without abnormal findings: Secondary | ICD-10-CM | POA: Diagnosis not present

## 2016-11-28 LAB — CYTOLOGY - PAP
Chlamydia: NEGATIVE
DIAGNOSIS: NEGATIVE
Neisseria Gonorrhea: NEGATIVE

## 2016-11-30 ENCOUNTER — Other Ambulatory Visit: Payer: Self-pay | Admitting: Obstetrics and Gynecology

## 2016-11-30 ENCOUNTER — Encounter (HOSPITAL_COMMUNITY): Payer: Self-pay | Admitting: *Deleted

## 2016-11-30 ENCOUNTER — Observation Stay (HOSPITAL_COMMUNITY)
Admission: AD | Admit: 2016-11-30 | Discharge: 2016-12-02 | Disposition: A | Discharge status: Home / Self Care | Payer: Medicaid Other | Source: Ambulatory Visit | Attending: Obstetrics and Gynecology | Admitting: Obstetrics and Gynecology

## 2016-11-30 ENCOUNTER — Ambulatory Visit (HOSPITAL_COMMUNITY)
Admission: RE | Admit: 2016-11-30 | Discharge: 2016-11-30 | Disposition: A | Discharge status: Home / Self Care | Payer: Medicaid Other | Source: Ambulatory Visit | Attending: Obstetrics and Gynecology | Admitting: Obstetrics and Gynecology

## 2016-11-30 ENCOUNTER — Encounter (HOSPITAL_COMMUNITY): Payer: Self-pay

## 2016-11-30 DIAGNOSIS — Z87891 Personal history of nicotine dependence: Secondary | ICD-10-CM | POA: Diagnosis not present

## 2016-11-30 DIAGNOSIS — E119 Type 2 diabetes mellitus without complications: Secondary | ICD-10-CM | POA: Diagnosis present

## 2016-11-30 DIAGNOSIS — Z3A11 11 weeks gestation of pregnancy: Secondary | ICD-10-CM | POA: Diagnosis not present

## 2016-11-30 DIAGNOSIS — Z7984 Long term (current) use of oral hypoglycemic drugs: Secondary | ICD-10-CM | POA: Insufficient documentation

## 2016-11-30 DIAGNOSIS — IMO0002 Reserved for concepts with insufficient information to code with codable children: Secondary | ICD-10-CM

## 2016-11-30 DIAGNOSIS — E1165 Type 2 diabetes mellitus with hyperglycemia: Secondary | ICD-10-CM | POA: Diagnosis present

## 2016-11-30 DIAGNOSIS — O10911 Unspecified pre-existing hypertension complicating pregnancy, first trimester: Secondary | ICD-10-CM | POA: Diagnosis not present

## 2016-11-30 DIAGNOSIS — O24911 Unspecified diabetes mellitus in pregnancy, first trimester: Principal | ICD-10-CM | POA: Insufficient documentation

## 2016-11-30 LAB — COMPREHENSIVE METABOLIC PANEL
ALBUMIN: 3.9 g/dL (ref 3.5–5.0)
ALK PHOS: 38 U/L (ref 38–126)
ALT: 18 U/L (ref 14–54)
AST: 26 U/L (ref 15–41)
Anion gap: 8 (ref 5–15)
BILIRUBIN TOTAL: 0.5 mg/dL (ref 0.3–1.2)
BUN: 6 mg/dL (ref 6–20)
CALCIUM: 9.4 mg/dL (ref 8.9–10.3)
CO2: 22 mmol/L (ref 22–32)
Chloride: 104 mmol/L (ref 101–111)
Creatinine, Ser: 0.51 mg/dL (ref 0.44–1.00)
GFR calc Af Amer: >60 mL/min (ref >60)
GLUCOSE: 124 mg/dL — AB (ref 65–99)
POTASSIUM: 3.9 mmol/L (ref 3.5–5.1)
Sodium: 134 mmol/L — ABNORMAL LOW (ref 135–145)
Total Protein: 7.7 g/dL (ref 6.5–8.1)

## 2016-11-30 LAB — PROTEIN / CREATININE RATIO, URINE
Creatinine, Urine: 86 mg/dL
Total Protein, Urine: <6 mg/dL

## 2016-11-30 LAB — CBC
HCT: 38.4 % (ref 36.0–46.0)
Hemoglobin: 13.2 g/dL (ref 12.0–15.0)
MCH: 29 pg (ref 26.0–34.0)
MCHC: 34.4 g/dL (ref 30.0–36.0)
MCV: 84.4 fL (ref 78.0–100.0)
PLATELETS: 387 10*3/uL (ref 150–400)
RBC: 4.55 MIL/uL (ref 3.87–5.11)
RDW: 12.8 % (ref 11.5–15.5)
WBC: 10.6 10*3/uL — ABNORMAL HIGH (ref 4.0–10.5)

## 2016-11-30 LAB — LACTATE DEHYDROGENASE: LDH: 135 U/L (ref 98–192)

## 2016-11-30 LAB — GLUCOSE, CAPILLARY: Glucose-Capillary: 134 mg/dL — ABNORMAL HIGH (ref 65–99)

## 2016-11-30 LAB — URIC ACID: URIC ACID, SERUM: 4.9 mg/dL (ref 2.3–6.6)

## 2016-11-30 MED ORDER — INSULIN GLARGINE 100 UNIT/ML ~~LOC~~ SOLN
42.0000 [IU] | Freq: Every day | SUBCUTANEOUS | Status: DC
  Administered 2016-11-30 – 2016-12-01 (×2): 42 [IU] via SUBCUTANEOUS
  Filled 2016-11-30 (×2): qty 0.42

## 2016-11-30 MED ORDER — DOCUSATE SODIUM 100 MG PO CAPS
100.0000 mg | ORAL_CAPSULE | Freq: Every day | ORAL | Status: DC
  Administered 2016-12-01 – 2016-12-02 (×2): 100 mg via ORAL
  Filled 2016-11-30 (×2): qty 1

## 2016-11-30 MED ORDER — INSULIN ASPART 100 UNIT/ML ~~LOC~~ SOLN
10.0000 [IU] | Freq: Three times a day (TID) | SUBCUTANEOUS | Status: DC
  Administered 2016-11-30 – 2016-12-01 (×4): 10 [IU] via SUBCUTANEOUS

## 2016-11-30 MED ORDER — ZOLPIDEM TARTRATE 5 MG PO TABS: 5.0000 mg | ORAL_TABLET | Freq: Every evening | ORAL | Status: DC | PRN

## 2016-11-30 MED ORDER — ACETAMINOPHEN 325 MG PO TABS: 650.0000 mg | ORAL_TABLET | ORAL | Status: DC | PRN

## 2016-11-30 MED ORDER — PRENATAL MULTIVITAMIN CH
1.0000 | ORAL_TABLET | Freq: Every day | ORAL | Status: DC
  Administered 2016-12-01 – 2016-12-02 (×2): 1 via ORAL
  Filled 2016-11-30 (×2): qty 1

## 2016-11-30 MED ORDER — CALCIUM CARBONATE ANTACID 500 MG PO CHEW: 2.0000 | CHEWABLE_TABLET | ORAL | Status: DC | PRN

## 2016-11-30 MED ORDER — NIFEDIPINE ER OSMOTIC RELEASE 30 MG PO TB24
30.0000 mg | ORAL_TABLET | Freq: Every day | ORAL | Status: DC
  Administered 2016-11-30 – 2016-12-02 (×3): 30 mg via ORAL
  Filled 2016-11-30 (×3): qty 1

## 2016-11-30 NOTE — H&P (Signed)
Megan Ibarra is a 26 y.o. female  G1P0, EDD 06/15/2017 who is currently at 11w 6 d seen by MFM today for  consultation due to poorly controlled type 2 diabetes and chronic hypertension. Dr. Olean Ree recommend admission to work on diabetic control , using insulin,  as well as management of chronic hypertension.  Megan Ibarra reports that she was first diagnosed with diabetes approximately 4-5 years ago.  She has not been compliant with her medications.  She was diagnosed with chronic hypertension about the same time.  She denies any known end-organ disease.  Her baseline HbA1C value was 8.6.  Prenatal care is provided by Dr. Gerald Leitz with [facility]. She had her first visit with Dr. Richardson Dopp last week and has made an effort to take Metformin since then however blood sugars have still not been well controlled. She repots bs 175-200 range. . She denies HA, visual changes.  She is otherwise without complaints.   OB History    Gravida Para Term Preterm AB Living   1         0   SAB TAB Ectopic Multiple Live Births                 Past Medical History:  Diagnosis Date  . Diabetes mellitus without complication (HCC)   *Chronic hypertension.    Past Surgical History:  Procedure Laterality Date  . NO PAST SURGERIES     Family History: family history is not on file. Social History:  reports that she quit smoking about 4 months ago. Her smoking use included Cigarettes. She smoked 0.25 packs per day. She has never used smokeless tobacco. She reports that she does not drink alcohol or use drugs.    ROS History   Blood pressure (!) 156/90, pulse 92, temperature 98.1 F (36.7 C), temperature source Oral, height 5\' 5"  (1.651 m), weight 110.8 kg (244 lb 4 oz), last menstrual period 08/31/2016, SpO2 99 %. Exam Physical Exam  Vitals reviewed. Constitutional: She is oriented to person, place, and time. She appears well-developed and well-nourished.  HENT:  Head: Normocephalic and  atraumatic.  Eyes: Pupils are equal, round, and reactive to light.  Neck: Normal range of motion.  Cardiovascular: Normal rate and regular rhythm.   Respiratory: Effort normal and breath sounds normal.  GI: Soft. She exhibits no distension. There is no tenderness. There is no rebound and no guarding.  Musculoskeletal: Normal range of motion.  Neurological: She is alert and oriented to person, place, and time. She has normal reflexes.  Skin: Skin is warm and dry.  Psychiatric: She has a normal mood and affect.    Prenatal labs: ABO, Rh:   A positive  Antibody:  negative  Rubella:  Immune  RPR:   Nonreactive  HBsAg:   negative  HIV:   Nonreactive  GBS:     Results for orders placed or performed during the hospital encounter of 11/30/16 (from the past 24 hour(s))  Protein / creatinine ratio, urine     Status: None   Collection Time: 11/30/16  3:15 PM  Result Value Ref Range   Creatinine, Urine 86.00 mg/dL   Total Protein, Urine <6 mg/dL   Protein Creatinine Ratio        0.00 - 0.15 mg/mg[Cre]  Comprehensive metabolic panel     Status: Abnormal   Collection Time: 11/30/16  3:20 PM  Result Value Ref Range   Sodium 134 (L) 135 - 145 mmol/L   Potassium 3.9  3.5 - 5.1 mmol/L   Chloride 104 101 - 111 mmol/L   CO2 22 22 - 32 mmol/L   Glucose, Bld 124 (H) 65 - 99 mg/dL   BUN 6 6 - 20 mg/dL   Creatinine, Ser 1.610.51 0.44 - 1.00 mg/dL   Calcium 9.4 8.9 - 09.610.3 mg/dL   Total Protein 7.7 6.5 - 8.1 g/dL   Albumin 3.9 3.5 - 5.0 g/dL   AST 26 15 - 41 U/L   ALT 18 14 - 54 U/L   Alkaline Phosphatase 38 38 - 126 U/L   Total Bilirubin 0.5 0.3 - 1.2 mg/dL   GFR calc non Af Amer >60 >60 mL/min   GFR calc Af Amer >60 >60 mL/min   Anion gap 8 5 - 15  Uric acid     Status: None   Collection Time: 11/30/16  3:20 PM  Result Value Ref Range   Uric Acid, Serum 4.9 2.3 - 6.6 mg/dL  Lactate dehydrogenase     Status: None   Collection Time: 11/30/16  3:20 PM  Result Value Ref Range   LDH 135 98 - 192  U/L  CBC on admission     Status: Abnormal   Collection Time: 11/30/16  3:20 PM  Result Value Ref Range   WBC 10.6 (H) 4.0 - 10.5 K/uL   RBC 4.55 3.87 - 5.11 MIL/uL   Hemoglobin 13.2 12.0 - 15.0 g/dL   HCT 04.538.4 40.936.0 - 81.146.0 %   MCV 84.4 78.0 - 100.0 fL   MCH 29.0 26.0 - 34.0 pg   MCHC 34.4 30.0 - 36.0 g/dL   RDW 91.412.8 78.211.5 - 95.615.5 %   Platelets 387 150 - 400 K/uL   Ultrasound : IMPRESSION: Single living IUP measuring 11 weeks 6 days, with US EDC of 06/15/2017.  Assessment/Plan: 1) 11 wks and 6 days EGA  Based on ultrasound today  2)uncontrolled type 2 diabetes affecting pregnancyuncontrolled   3)chronic hypertension.   MFM recommendations as follows.:   Type 2 diabetes - admitted for diabetic patterning and diabetic education.  begin insulin therapy.  It may be reasonable to watch blood sugars for 24 hrs - but I believe that insulin therapy is almost unavoidable.  I would start at a dose of ~ 0.7 units/ kg.  Would recommend 42 units of Lantus or Levimir at night; 10 units of Novalog at breakfast, lunch and dinner.  Would check fasting and 2 hr post prandial values.  Megan Ibarra will need a detailed ultrasound at 18 weeks and fetal echo at 20-22 weeks (tentatively scheduled) She will also need serial ultrasounds for growth every 4 weeks, antenatal testing beginning no later than 32 weeks and delivery at 39 weeks in the absence of other complications.              3) Chronic hypertension - poorly controlled.  Patient had some severe range blood pressures in clinic - currently asymptomatic.  Would recommend a dose of po Procardia 30 mg once admitted.  Personally feel that Procardia may be more efficacious for management of blood pressure - would start at dose of Procardia XL  30 or 60 mg and increase as appropriate (max 120 mg daily) to achieve blood pressures in the 140-150/90s.  While hospitalized, would be reasonable to check baseline labs - 24 hr urine protein, creatinine  clearance and preeclampsia labs.  CCOB covering ater 7 pm 11/30/2016 Megan Ibarra. 11/30/2016, 6:30 PM

## 2016-11-30 NOTE — Consult Note (Signed)
Maternal Fetal Medicine Consultation  Requesting Provider(s): Gerald Leitzara Cole, MD  Reason for consultation: Poorly controlled type 2 diabetes, chronic hypertension  HPI: Megan Ibarra is a 26 yo G1P0, EDD 06/07/2017 who is currently at 13w 0d seen for consultation due to poorly controlled type 2 diabetes and chronic hypertension.  Megan Ibarra reports that she was first diagnosed with diabetes approximately 4-5 years ago.  She has not been compliant with her medications.  She was diagnosed with chronic hypertension about the same time.  She denies any known end-organ disease.  Her baseline HbA1C value was 8.6.    After meeting with Dr. Richardson Doppole last week, she has made an effort to take her Metformin.  She did not bring in her fingerstick log, but reports that most of the values are in the 175 mg/dl but mostly less than 191200.  She denies HA, visual changes.  She is otherwise without complaints.  OB History: OB History    Gravida Para Term Preterm AB Living   1         0   SAB TAB Ectopic Multiple Live Births                  PMH:  Past Medical History:  Diagnosis Date  . Diabetes mellitus without complication (HCC)     PSH:  Past Surgical History:  Procedure Laterality Date  . NO PAST SURGERIES     Meds:  Current Outpatient Prescriptions on File Prior to Encounter  Medication Sig Dispense Refill  . ipratropium (ATROVENT) 0.06 % nasal spray Place 2 sprays into the nose 4 (four) times daily. 15 mL 12  . ipratropium (ATROVENT) 0.06 % nasal spray Place 2 sprays into both nostrils 4 (four) times daily. 15 mL 1  . labetalol (NORMODYNE) 200 MG tablet Take 1 tablet (200 mg total) by mouth 2 (two) times daily. 60 tablet 9  . metFORMIN (GLUCOPHAGE) 500 MG tablet Take 1 tablet (500 mg total) by mouth 2 (two) times daily with a meal. 60 tablet 5  . Prenatal Multivit-Min-Fe-FA (PRENATAL VITAMINS) 0.8 MG tablet Take 1 tablet by mouth daily. 30 tablet 12   No current facility-administered  medications on file prior to encounter.    Allergies: Allergies  Allergen Reactions  . Latex    FH: History reviewed. No pertinent family history.   Soc: Social History   Social History  . Marital status: Single    Spouse name: N/A  . Number of children: N/A  . Years of education: N/A   Occupational History  . Not on file.   Social History Main Topics  . Smoking status: Former Smoker    Packs/day: 0.25    Types: Cigarettes    Quit date: 07/02/2016  . Smokeless tobacco: Never Used  . Alcohol use Yes  . Drug use: No  . Sexual activity: Yes    Birth control/ protection: None   Other Topics Concern  . Not on file   Social History Narrative  . No narrative on file    Review of Systems: no vaginal bleeding or cramping/contractions, no LOF, no nausea/vomiting. All other systems reviewed and are negative.   PE:   Vitals:   11/30/16 1257  BP: (!) 161/81  Pulse: 94   A/P: 1) Single IUP at 13w 0d  2) Type 2 diabetes - stressed the importance of tight blood sugar control in pregnancy, particularly in the first trimester.  We discussed the risk of congenital birth defects associated with poorly  controlled diabetes in early pregnancy.  Given the need for tight blood sugar control, I would recommend that she be admitted for diabetic patterning and diabetic education.  While the patient does not have her fingerstick log with her, based on our conversation she will most likely need to begin insulin therapy.  It may be reasonable to watch blood sugars for 24 hrs - but I believe that insulin therapy is almost unavoidable.  I would start at a dose of ~ 0.7 units/ kg.  Would recommend 42 units of Lantus or Levimir at night; 10 units of Novalog at breakfast, lunch and dinner.  Would check fasting and 2 hr post prandial values.  Megan Ibarra will need a detailed ultrasound at 18 weeks and fetal echo at 20-22 weeks (tentatively scheduled) She will also need serial ultrasounds for  growth every 4 weeks, antenatal testing beginning no later than 32 weeks and delivery at 39 weeks in the absence of other complications.   3) Chronic hypertension - poorly controlled.  Patient had some severe range blood pressures in clinic - currently asymptomatic.  Would recommend a dose of po Procardia 30 mg once admitted.  Personally feel that Procardia may be more efficacious for management of blood pressure - would start at dose of Procardia XL  30 or 60 mg and increase as appropriate (max 120 mg daily) to achieve blood pressures in the 140-150/90s.  While hospitalized, would be reasonable to check baseline labs - 24 hr urine protein, creatinine clearance and preeclampsia labs.  Recommendations discussed with Dr. Richardson Dopp.  Thank you for the opportunity to be a part of the care of Megan Ibarra. Please contact our office if we can be of further assistance.   I spent approximately 30 minutes with this patient with over 50% of time spent in face-to-face counseling.  Alpha Gula, MD Maternal Fetal Medicine

## 2016-12-01 DIAGNOSIS — O24911 Unspecified diabetes mellitus in pregnancy, first trimester: Secondary | ICD-10-CM | POA: Diagnosis not present

## 2016-12-01 LAB — GLUCOSE, CAPILLARY
GLUCOSE-CAPILLARY: 115 mg/dL — AB (ref 65–99)
GLUCOSE-CAPILLARY: 131 mg/dL — AB (ref 65–99)
GLUCOSE-CAPILLARY: 111 mg/dL — AB (ref 65–99)
Glucose-Capillary: 127 mg/dL — ABNORMAL HIGH (ref 65–99)

## 2016-12-01 MED ORDER — INSULIN STARTER KIT- SYRINGES (ENGLISH)
1.0000 | Freq: Once | Status: DC
  Filled 2016-12-01: qty 1

## 2016-12-01 NOTE — Progress Notes (Signed)
Consult for Nutrition eduction acknowledged. RD not on campus during the weekends. Will educate pt Sunday afternoon if needed. Please apprise of discharge day.   Elisabeth CaraKatherine Sybol Morre M.Odis LusterEd. R.D. LDN Neonatal Nutrition Support Specialist/RD III Pager [phone]      Phone [phone]

## 2016-12-01 NOTE — Progress Notes (Signed)
Managing Gestational Diabetes booklet, Medication information on Novolog & Lantus Insulin given to pt for review at request of Diabetes Coordinator.

## 2016-12-01 NOTE — Progress Notes (Signed)
Pt education done regarding sq injection technique, pt was able to give herself PM insulin dose

## 2016-12-01 NOTE — Progress Notes (Addendum)
Inpatient Diabetes Program Recommendations  Diabetes Treatment Program Recommendations  ADA Standards of Care 2016 Diabetes in Pregnancy Target Glucose Ranges:  Fasting: 60 - 90 mg/dL Preprandial: 60 - 105 mg/dL 1 hr postprandial: Less than 123m/dL (from first bite of meal) 2 hr postprandial: Less than 120 mg/dL (from first bite of meal)     Results for Megan Ibarra, Megan Ibarra(MRN [id number] as of 12/01/2016 15:38  Ref. Range 11/30/2016 19:24 12/01/2016 06:20 12/01/2016 11:33  Glucose-Capillary Latest Ref Range: 65 - 99 mg/dL 134 (H) 115 (H) 131 (H)    Review of Glycemic Control  Diabetes history: DM2 Outpatient Diabetes medications: Metformin 500 mg BID Current orders for Inpatient glycemic control: Lantus 42 units QHS, Novolog 10 units TID with meals  Inpatient Diabetes Program Recommendations: Correction (SSI): Please consider using Diabetic Pregnant Patient order set to order CBGs with Novolog correction QID (fasting and 2 hour post prandial glucose). Insulin-basal: Please consider increasing Lantus to 44 units QHS. Insulin-Meal Coverage: Please consider increasing meal coverage to Novolog 12 units TID with meals.  NOTE: Patient is [redacted]W[redacted]D, DM2 hx, and has taken Metformin for DM control in the past. Diabetes Coordinator is not on campus but is available over the phone for questions over the weekend from 8am-5pm, Spoke with patient over the phone regarding DM control especially during pregnancy. Patient states that she has had DM2 for 3-4 years and that she was taking Metformin in the past but she stopped taking Metformin about 2 years ago because of GI intolerance.  Patient reports that she restarted on Metformin about 2 weeks ago and she has been monitoring glucose at home. Glucose is still not in target glucose range with Metformin and patient has been started on SQ insulin. Discussed importance of checking CBGs and maintaining good CBG control to prevent long-term and short-term  complications of uncontrolled DM. Discussed importance of DM control during pregnancy and importance of glycemic control for her and her baby.  Explained how hyperglycemia leads to complications during pregnancy.  Reviewed A1C goal, fasting (60-90 mg/dl), and 2 hour post prandial ( less than 120 mg/dl 2 hours after first bite of meal) glucose goals during pregnancy.  Discussed how glycemic control is effected as pregnancy progresses. Explained to patient that she will need to be on insulin for DM control during pregnancy. Explained that she will likely need frequent adjustments with insulin regimen during pregnancy.  Discussed impact of nutrition during pregnancy and patient states that she has a basic understanding of Carb Modified diet. Discussed Gestational Carb Modified diet and informed patient that a consult for RD was ordered for further diet education.  Discussed Lantus and Novolog insulin (onset, duration) and how they are currently ordered. Patient states that she has never used insulin for herself in the past but reports that she has given her aunt insulin (vial/syringe) in the past. Informed patient that the nursing staff will be asking her to self-administer insulin and that an insulin starter kit will be ordered. Discussed hypoglycemia (less than 60 mg/dl) along with proper treatment. Encouraged patient to check her glucose at least 4 times per day (fasting and 2 hour post prandial (from first bite of meal)).  Encouraged to keep a log book of glucose readings and insulin taken which she will need to take to doctor appointments.  Encouraged patient to reach out to her doctor if her glucose was consistently above target glucose ranges.   Patient verbalized understanding of information discussed and she states that she has no  further questions at this time related to diabetes.  Talked with Caryl Pina, RN and asked that patient be given Managing Gestational Diabetes book so patient can read and gain more  knowledge about importance of glycemic control during pregnancy.  Also asked that RNs work with patient on educating on insulin and allow patient to self-administer insulin to ensure proper technique.   Thanks, Barnie Alderman, RN, MSN, CDE Diabetes Coordinator Inpatient Diabetes Program [phone] (Team Pager)

## 2016-12-01 NOTE — Progress Notes (Addendum)
Hospital day # 1 pregnancy at [redacted]w[redacted]d S: well,            Vaginal bleeding:none       Vaginal discharge: none and no significant change  O: BP 132/79   Pulse 79   Temp 98 F (36.7 C) (Oral)   Resp 18   Ht _0  (1.651 m)   Wt 244 lb 4 oz (110.8 kg)   LMP 08/31/2016   SpO2 97%   BMI 40.65 kg/m             Uterus gravid, consistent with 13 weeks and non-tender      Extremities: extremities normal, atraumatic, no cyanosis or edema and no significant edema and no signs of DVT  A: 161w1dith Chronic HTN; Insulin required type 2 Diabetes- poorly controlled     stable  P: continue current plan of care  LoCecille Rubin Clemmons cnm 12/01/2016 12:15 PM  I saw and examined patient at bedside and agree with above findings, assessment and plan as per CNM Clemmons. FSG post dinner 3/16: 134.  Fasting 3/17: 115. Post breakfast 3/17: 131.  RN reports that she spoke to diabetes educator today and educator said they would stop by sometime today to evaluate patient, at this point still awaiting their arrival. Blood pressures are better controlled with nifedipine.   . docusate sodium  100 mg Oral Daily  . insulin aspart  10 Units Subcutaneous TID WC  . insulin glargine  42 Units Subcutaneous QHS  . insulin starter kit- syringes  1 kit Other Once  . NIFEdipine  30 mg Oral Daily  . prenatal multivitamin  1 tablet Oral Q1200    Dr. KuAlesia Richards

## 2016-12-02 DIAGNOSIS — O24911 Unspecified diabetes mellitus in pregnancy, first trimester: Secondary | ICD-10-CM | POA: Diagnosis not present

## 2016-12-02 LAB — CREATININE CLEARANCE, URINE, 24 HOUR
COLLECTION INTERVAL-CRCL: 24 h
CREAT CLEAR: 311 mL/min — AB (ref 75–115)
CREATININE 24H UR: 2283 mg/d — AB (ref 600–1800)
CREATININE, URINE: 157.48 mg/dL
Urine Total Volume-CRCL: 1450 mL

## 2016-12-02 LAB — PROTEIN, URINE, 24 HOUR
Collection Interval-UPROT: 24 hours
Protein, Urine: <6 mg/dL
URINE TOTAL VOLUME-UPROT: 1450 mL

## 2016-12-02 LAB — GLUCOSE, CAPILLARY
GLUCOSE-CAPILLARY: 118 mg/dL — AB (ref 65–99)
GLUCOSE-CAPILLARY: 157 mg/dL — AB (ref 65–99)

## 2016-12-02 MED ORDER — INSULIN ASPART 100 UNIT/ML ~~LOC~~ SOLN: 12.0000 [IU] | Freq: Three times a day (TID) | SUBCUTANEOUS | 11 refills | Status: DC

## 2016-12-02 MED ORDER — NIFEDIPINE ER 30 MG PO TB24: 30.0000 mg | ORAL_TABLET | Freq: Every day | ORAL | 3 refills | Status: DC

## 2016-12-02 MED ORDER — INSULIN ASPART 100 UNIT/ML ~~LOC~~ SOLN
12.0000 [IU] | Freq: Three times a day (TID) | SUBCUTANEOUS | Status: DC
  Administered 2016-12-02 (×2): 12 [IU] via SUBCUTANEOUS

## 2016-12-02 MED ORDER — INSULIN ASPART 100 UNIT/ML ~~LOC~~ SOLN
0.0000 [IU] | Freq: Three times a day (TID) | SUBCUTANEOUS | Status: DC
  Administered 2016-12-02: 5 [IU] via SUBCUTANEOUS

## 2016-12-02 MED ORDER — INSULIN GLARGINE 100 UNIT/ML ~~LOC~~ SOLN: 46.0000 [IU] | Freq: Every day | SUBCUTANEOUS | 11 refills | Status: DC

## 2016-12-02 MED ORDER — INSULIN GLARGINE 100 UNIT/ML ~~LOC~~ SOLN
44.0000 [IU] | Freq: Every day | SUBCUTANEOUS | Status: DC
  Filled 2016-12-02: qty 0.44

## 2016-12-02 NOTE — Progress Notes (Signed)
  Nutrition Dx: Food and nutrition-related knowledge deficit r/t no previous education aeb newly diagnosed GDM.   Nutrition education consult for Carbohydrate Modified Gestational Diabetic Diet completed.  "Meal  plan for gestational diabetics" handout given to patient.  Basic concepts reviewed.  Questions answered.  Patient verbalizes understanding.  This pt will benefit from further reinforcement of diet parameters as an outpt, as she is not used to following  a structured diet and this will be a great lifestyle change for her  Inez PilgrimKatherine Bethlehem Langstaff M.Odis LusterEd. R.D. LDN Neonatal Nutrition Support Specialist/RD III Pager [phone]      Phone [phone]

## 2016-12-02 NOTE — Progress Notes (Signed)
Discharge instructions given, questions answered, pt states understanding, signs and given copy 

## 2016-12-02 NOTE — Discharge Summary (Signed)
Antenatal Physician Discharge Summary  Patient ID: Megan Ibarra MRN: [id number] DOB/AGE: Mar 08, 1991 26 y.o.  Admit date: 11/30/2016 Discharge date: 12/02/2016  Admission Diagnoses: Poorly controlled chronic hypertension and type 2 diabetes in pregnancy  Discharge Diagnoses: same  Prenatal Procedures: MFM consult  Significant Diagnostic Studies:  Results for orders placed or performed during the hospital encounter of 11/30/16 (from the past 168 hour(s))  Protein / creatinine ratio, urine   Collection Time: 11/30/16  3:15 PM  Result Value Ref Range   Creatinine, Urine 86.00 mg/dL   Total Protein, Urine <6 mg/dL   Protein Creatinine Ratio        0.00 - 0.15 mg/mg[Cre]  Comprehensive metabolic panel   Collection Time: 11/30/16  3:20 PM  Result Value Ref Range   Sodium 134 (L) 135 - 145 mmol/L   Potassium 3.9 3.5 - 5.1 mmol/L   Chloride 104 101 - 111 mmol/L   CO2 22 22 - 32 mmol/L   Glucose, Bld 124 (H) 65 - 99 mg/dL   BUN 6 6 - 20 mg/dL   Creatinine, Ser 4.09 0.44 - 1.00 mg/dL   Calcium 9.4 8.9 - 81.1 mg/dL   Total Protein 7.7 6.5 - 8.1 g/dL   Albumin 3.9 3.5 - 5.0 g/dL   AST 26 15 - 41 U/L   ALT 18 14 - 54 U/L   Alkaline Phosphatase 38 38 - 126 U/L   Total Bilirubin 0.5 0.3 - 1.2 mg/dL   GFR calc non Af Amer >60 >60 mL/min   GFR calc Af Amer >60 >60 mL/min   Anion gap 8 5 - 15  Uric acid   Collection Time: 11/30/16  3:20 PM  Result Value Ref Range   Uric Acid, Serum 4.9 2.3 - 6.6 mg/dL  Lactate dehydrogenase   Collection Time: 11/30/16  3:20 PM  Result Value Ref Range   LDH 135 98 - 192 U/L  CBC on admission   Collection Time: 11/30/16  3:20 PM  Result Value Ref Range   WBC 10.6 (H) 4.0 - 10.5 K/uL   RBC 4.55 3.87 - 5.11 MIL/uL   Hemoglobin 13.2 12.0 - 15.0 g/dL   HCT 91.4 78.2 - 95.6 %   MCV 84.4 78.0 - 100.0 fL   MCH 29.0 26.0 - 34.0 pg   MCHC 34.4 30.0 - 36.0 g/dL   RDW 21.3 08.6 - 57.8 %   Platelets 387 150 - 400 K/uL  Creatinine clearance, urine, 24  hour   Collection Time: 11/30/16  4:00 PM  Result Value Ref Range   Urine Total Volume-CRCL 1,450 mL   Collection Interval-CRCL 24 hours   Creatinine, Urine 157.48 mg/dL   Creatinine, 46N Ur 6,295 (H) 600 - 1,800 mg/day   Creatinine Clearance 311 (H) 75 - 115 mL/min  Protein, urine, 24 hour   Collection Time: 11/30/16  4:00 PM  Result Value Ref Range   Urine Total Volume-UPROT 1,450 mL   Collection Interval-UPROT 24 hours   Protein, Urine <6 mg/dL   Protein, 28U Urine        50 - 100 mg/day  Glucose, capillary   Collection Time: 11/30/16  7:24 PM  Result Value Ref Range   Glucose-Capillary 134 (H) 65 - 99 mg/dL  Glucose, capillary   Collection Time: 12/01/16  6:20 AM  Result Value Ref Range   Glucose-Capillary 115 (H) 65 - 99 mg/dL  Glucose, capillary   Collection Time: 12/01/16 11:33 AM  Result Value Ref Range   Glucose-Capillary 131 (  H) 65 - 99 mg/dL   Comment 1 Notify RN    Comment 2 Document in Chart   Glucose, capillary   Collection Time: 12/01/16  4:04 PM  Result Value Ref Range   Glucose-Capillary 111 (H) 65 - 99 mg/dL   Comment 1 Notify RN    Comment 2 Document in Chart   Glucose, capillary   Collection Time: 12/01/16  9:29 PM  Result Value Ref Range   Glucose-Capillary 127 (H) 65 - 99 mg/dL  Glucose, capillary   Collection Time: 12/02/16  6:50 AM  Result Value Ref Range   Glucose-Capillary 118 (H) 65 - 99 mg/dL  Glucose, capillary   Collection Time: 12/02/16 11:34 AM  Result Value Ref Range   Glucose-Capillary 157 (H) 65 - 99 mg/dL  Results for orders placed or performed in visit on 11/26/16 (from the past 168 hour(s))  Cytology - PAP   Collection Time: 11/26/16 12:00 AM  Result Value Ref Range   Adequacy      Satisfactory for evaluation  endocervical/transformation zone component PRESENT.   Diagnosis      NEGATIVE FOR INTRAEPITHELIAL LESIONS OR MALIGNANCY.   Diagnosis      A LETTER WAS SENT TO THE PATIENT INFORMING HER OF THE ABOVE RESULTS.    Chlamydia Negative    Neisseria gonorrhea Negative    Material Submitted CervicoVaginal Pap [ThinPrep Imaged]     Hospital Course:  This is a 26 y.o. G1P0 with IUP at 4157w2d admitted for poor control over chronic hypertension and diabetes type 2.. She was educated by Diabetes manangement via phone and will continue her management outpatient.  She will go home taking to Procardia 30mg  XL. She was deemed stable for discharge to home with outpatient follow up.  Discharge Exam: BP 134/75 (BP Location: Right Arm)   Pulse 94   Temp 98.5 F (36.9 C) (Oral)   Resp 18   Ht 5\' 5"  (1.651 m)   Wt 244 lb 4 oz (110.8 kg)   LMP 08/31/2016   SpO2 100%   BMI 40.65 kg/m  Physical Examination: General appearance- awake, alert, appears stated age Lungs- Clear to A&P Heart- RRR Abdomen- Gravid, non tender Extremities- negative homans Discharge Condition: stable  Disposition: home  Discharge Instructions    Discharge activity:  No Restrictions    Complete by:  As directed    Discharge diet:    Complete by:  As directed    Diabetic diet   Discharge instructions    Complete by:  As directed    Call office on Monday and make appointment for this week. Bring Blood glucose log   No sexual activity restrictions    Complete by:  As directed      Allergies as of 12/02/2016      Reactions   Latex Swelling, Rash      Medication List    STOP taking these medications   ipratropium 0.06 % nasal spray Commonly known as:  ATROVENT   labetalol 200 MG tablet Commonly known as:  NORMODYNE   metFORMIN 500 MG tablet Commonly known as:  GLUCOPHAGE     TAKE these medications   insulin aspart 100 UNIT/ML injection Commonly known as:  novoLOG Inject 12 Units into the skin 3 (three) times daily with meals.   insulin glargine 100 UNIT/ML injection Commonly known as:  LANTUS Inject 0.46 mLs (46 Units total) into the skin at bedtime.   NIFEdipine 30 MG 24 hr tablet Commonly known as:   PROCARDIA-XL/ADALAT  CC Take 1 tablet (30 mg total) by mouth daily. Start taking on:  12/03/2016   Prenatal Vitamins 0.8 MG tablet Take 1 tablet by mouth daily.      Follow-up Information    [facility]. Call.   Specialty:  Obstetrics and Gynecology Why:  make appointment for this week with Marcelene Butte; follow up with Diabetes management outpatient Contact information: [address] [place] [address] [phone]           Signed: Illene Bolus, CNM  3:31 PM

## 2016-12-02 NOTE — Progress Notes (Signed)
Hospital day # 1 pregnancy at [redacted]w[redacted]d uncontrolled chronic HTN and Type 2 diabetes-Insulin required  S:  Doing ok. Would like to go home but waiting for Diabetes education consult follow up today. Insulin change per Dr. RCletis Mediatoday        O: BP 123/60 (BP Location: Right Arm)   Pulse 88   Temp 98.5 F (36.9 C) (Oral)   Resp 18   Ht 5' 5" (1.651 m)   Wt 244 lb 4 oz (110.8 kg)   LMP 08/31/2016   SpO2 100%   BMI 40.65 kg/m           Uterus consistent with 13 weeks and non-tender      Extremities: extremities normal, atraumatic, no cyanosis or edema and no significant edema and no signs of DVT          Labs:   Results for orders placed or performed during the hospital encounter of 11/30/16 (from the past 24 hour(s))  Glucose, capillary     Status: Abnormal   Collection Time: 12/01/16 11:33 AM  Result Value Ref Range   Glucose-Capillary 131 (H) 65 - 99 mg/dL   Comment 1 Notify RN    Comment 2 Document in Chart   Glucose, capillary     Status: Abnormal   Collection Time: 12/01/16  4:04 PM  Result Value Ref Range   Glucose-Capillary 111 (H) 65 - 99 mg/dL   Comment 1 Notify RN    Comment 2 Document in Chart   Glucose, capillary     Status: Abnormal   Collection Time: 12/01/16  9:29 PM  Result Value Ref Range   Glucose-Capillary 127 (H) 65 - 99 mg/dL  Glucose, capillary     Status: Abnormal   Collection Time: 12/02/16  6:50 AM  Result Value Ref Range   Glucose-Capillary 118 (H) 65 - 99 mg/dL         Meds:  . docusate sodium  100 mg Oral Daily  . insulin aspart  0-32 Units Subcutaneous TID WC  . insulin aspart  12 Units Subcutaneous TID WC  . insulin glargine  44 Units Subcutaneous QHS  . insulin starter kit- syringes  1 kit Other Once  . NIFEdipine  30 mg Oral Daily  . prenatal multivitamin  1 tablet Oral Q1200    A: 127w2dith uncontrolled chronic htn and diabetes type 2     stable  P: Continue current plan of care      Upcoming tests/treatments:  Diabetes Education  consult      MDs will follow  [place] 12/02/2016 10:27 AM

## 2016-12-02 NOTE — Discharge Instructions (Signed)
Type 1 or Type 2 Diabetes Mellitus During Pregnancy, Diagnosis Type 1 diabetes (type 1 diabetes mellitus) and type 2 diabetes (type 2 diabetes mellitus) are long-term (chronic) diseases. In type 1 diabetes, the pancreas does not make enough of a hormone called insulin. In Type 2 diabetes, one or both of these problems may be present:  The pancreas does not make enough insulin.  Cells in the body do not respond properly to insulin that the body makes (insulin resistance). Normally, insulin allows sugars (glucose) to enter cells in the body. The cells use glucose for energy. Insulin resistance or lack of insulin causes excess glucose to build up in the blood instead of going into cells. As a result, high blood glucose (hyperglycemia) develops. What increases the risk? You may be more likely to develop diabetes if you have a family history of diabetes. Other factors may make you more likely to develop type 1 diabetes, such as:  Having a gene for type 1 diabetes that is passed along from parent to child (inherited).  Living in an area that has cold weather conditions.  Exposure to certain viruses.  Certain conditions in which the body's disease-fighting system attacks itself (autoimmune disorders). Other factors may make you more likely to develop type 2 diabetes, such as:  Being overweight or obese.  An inactive (sedentary) lifestyle.  Having a history of:  Prediabetes.  Gestational diabetes.  Polycystic ovarian syndrome (PCOS).  Being of American-Indian, African-American, Hispanic/Latino, or Asian/Pacific Islander descent. What are the signs or symptoms? Some of the symptoms of diabetes may be similar to normal symptoms of pregnancy. Symptoms of type 1 or type 2 diabetes may include:  Increased thirst (polydipsia).  Increased urination (polyuria).  Increased hunger (polyphagia).  Increased urination during the night (nocturia).  Sudden or unexplained weight  changes.  Frequent infections that keep coming back (recurring).  Fatigue.  Weakness.  Vision changes, such as blurry vision.  Fruity-smelling breath.  Cuts or bruises that are slow to heal.  Tingling or numbness in the hands or feet.  Dark patches on the skin (acanthosis nigricans). How is this diagnosed? Diabetes is diagnosed based on your symptoms, your medical history, a physical exam, and blood glucose level. Your blood glucose level may be checked with one or more of the following blood tests:  A fasting blood glucose (FBG) test. You will not be allowed to eat (you will fast) for at least 8 hours before a blood sample is taken.  A random blood glucose test. This checks your blood glucose at any time of day regardless of when you ate.  An A1c (hemoglobin A1c) blood test. This provides information about blood glucose control over the previous 2-3 months. You may be diagnosed with diabetes if:  Your FBG level is 126 mg/dL (7.0 mmol/L) or higher.  Your random blood glucose level is 200 mg/dL (16.1 mmol/L) or higher.  Your A1c level is 6.5% or higher. These blood tests may be repeated to confirm your diagnosis. If you have risk factors for diabetes, you may be screened for diabetes at your first health care visit during your pregnancy (prenatal visit). How is this treated? Your treatment may be managed by a specialist called an endocrinologist. Your health care provider may treat your diabetes by instructing you to:  Take insulin or other medicines daily. This helps to keep your blood glucose levels in the healthy range.  If you use insulin, you may need to adjust your dosage based on how physically active  you are and what foods you eat. Your health care provider will tell you how to do this.  Check your blood glucose. Do this as often as told.  Make diet and lifestyle changes. These may include:  Following an individualized nutrition plan that is developed by a diet and  nutrition specialist (registered dietitian).  Exercising regularly.  Finding ways to manage stress.  Take medicines to help prevent complications from diabetes, such as:  Aspirin.  Medicine to lower cholesterol.  Medicine to control blood pressure. Your health care provider will set individualized treatment goals for you. Generally, the goal of treatment is to maintain the following blood glucose levels during pregnancy:  Fasting: at or below 95 mg/dL (5.3 mmol/L).  After meals (postprandial):  One hour after a meal: at or below 140 mg/dL (7.8 mmol/L).  Two hours after a meal: at or below 120 mg/dL (6.7 mmol/L).  A1c level: 6-6.5%. Follow these instructions at home: Questions to ask your health care provider   Consider asking the following questions:  Do I need to meet with a diabetes educator?  Where can I find a support group for people with diabetes?  What equipment will I need to manage my diabetes at home?  What diabetes medicines do I need, and when should I take them?  How often do I need to check my blood glucose?  What number can I call if I have questions?  When is my next appointment? General instructions   Take over-the-counter and prescription medicines only as told by your health care provider.  Manage your weight gain during pregnancy. The amount of weight that you are expected to gain depends on your pre-pregnancy BMI (body mass index).  Keep all follow-up visits as told by your health care provider. This is important.  For more information about diabetes, visit:  American Diabetes Association (ADA): www.diabetes.org  American Association of Diabetes Educators (AADE): www.diabeteseducator.org/patient-resources Contact a health care provider if:  Your blood glucose is at or above 240 mg/dL (45.413.3 mmol/L).  Your blood glucose is at or above 200 mg/dL (09.811.1 mmol/L) and you have ketones in your urine.  You have been sick or have had a fever for  2 days or more and you are not getting better.  You have any of the following problems for more than 6 hours:  You cannot eat or drink.  You have nausea and vomiting.  You have diarrhea. Get help right away if:  Your blood glucose is at or below 54 mg/dL (3 mmol/L).  You become confused or you have trouble thinking clearly.  You have difficulty breathing.  You have moderate or large ketone levels in your urine.  Your baby is moving around less than usual.  You develop unusual discharge or bleeding from your vagina.  You start having contractions early (prematurely). Contractions may feel like a tightening in your lower abdomen. What increases the risk? If diabetes is treated, it is unlikely to cause problems. If it is not controlled with treatment, it may cause problems during labor and delivery, and some of those problems can be harmful to the unborn baby (fetus) and the mother. Uncontrolled diabetes may also cause the newborn baby to have breathing problems and low blood glucose. This information is not intended to replace advice given to you by your health care provider. Make sure you discuss any questions you have with your health care provider. Document Released: 05/28/2012 Document Revised: 02/09/2016 Document Reviewed: 10/07/2015 Elsevier Interactive Patient Education  2017 [employer]. Hypertension During Pregnancy Hypertension is also called high blood pressure. High blood pressure means that the force of your blood moving in your body is too strong. When you are pregnant, this condition should be watched carefully. It can cause problems for you and your baby. Follow these instructions at home: Eating and drinking   Drink enough fluid to keep your pee (urine) clear or pale yellow.  Eat healthy foods that are low in salt (sodium).  Do not add salt to your food.  Check labels on foods and drinks to see much salt is in them. Look on the label where you see  "Sodium." Lifestyle   Do not use any products that contain nicotine or tobacco, such as cigarettes and e-cigarettes. If you need help quitting, ask your doctor.  Do not use alcohol.  Avoid caffeine.  Avoid stress. Rest and get plenty of sleep. General instructions   Take over-the-counter and prescription medicines only as told by your doctor.  While lying down, lie on your left side. This keeps pressure off your baby.  While sitting or lying down, raise (elevate) your feet. Try putting some pillows under your lower legs.  Exercise regularly. Ask your doctor what kinds of exercise are best for you.  Keep all prenatal and follow-up visits as told by your doctor. This is important. Contact a doctor if:  You have symptoms that your doctor told you to watch for, such as:  Fever.  Throwing up (vomiting).  Headache. Get help right away if:  You have very bad pain in your belly (abdomen).  You are throwing up, and this does not get better with treatment.  You suddenly get swelling in your hands, ankles, or face.  You gain 4 lb (1.8 kg) or more in 1 week.  You get bleeding from your vagina.  You have blood in your pee.  You do not feel your baby moving as much as normal.  You have a change in vision.  You have muscle twitching or sudden tightening (spasms).  You have trouble breathing.  Your lips or fingernails turn blue. This information is not intended to replace advice given to you by your health care provider. Make sure you discuss any questions you have with your health care provider. Document Released: 10/06/2010 Document Revised: 05/15/2016 Document Reviewed: 05/15/2016 Elsevier Interactive Patient Education  2017 [employer].

## 2016-12-04 ENCOUNTER — Other Ambulatory Visit (HOSPITAL_COMMUNITY): Payer: Self-pay | Admitting: *Deleted

## 2016-12-04 DIAGNOSIS — O24119 Pre-existing diabetes mellitus, type 2, in pregnancy, unspecified trimester: Secondary | ICD-10-CM

## 2017-01-04 ENCOUNTER — Ambulatory Visit (HOSPITAL_COMMUNITY)
Admission: RE | Admit: 2017-01-04 | Discharge: 2017-01-04 | Disposition: A | Discharge status: Home / Self Care | Payer: Medicaid Other | Source: Ambulatory Visit | Attending: Obstetrics and Gynecology | Admitting: Obstetrics and Gynecology

## 2017-01-04 ENCOUNTER — Encounter (HOSPITAL_COMMUNITY): Payer: Self-pay

## 2017-01-04 ENCOUNTER — Other Ambulatory Visit (HOSPITAL_COMMUNITY): Payer: Self-pay | Admitting: Maternal and Fetal Medicine

## 2017-01-04 VITALS — BP 148/71 | HR 96 | Wt 244.0 lb

## 2017-01-04 DIAGNOSIS — Z3689 Encounter for other specified antenatal screening: Secondary | ICD-10-CM

## 2017-01-04 DIAGNOSIS — O24119 Pre-existing diabetes mellitus, type 2, in pregnancy, unspecified trimester: Secondary | ICD-10-CM

## 2017-01-04 DIAGNOSIS — O10012 Pre-existing essential hypertension complicating pregnancy, second trimester: Secondary | ICD-10-CM | POA: Diagnosis not present

## 2017-01-04 DIAGNOSIS — O24312 Unspecified pre-existing diabetes mellitus in pregnancy, second trimester: Secondary | ICD-10-CM | POA: Diagnosis not present

## 2017-01-04 DIAGNOSIS — Z3A16 16 weeks gestation of pregnancy: Secondary | ICD-10-CM | POA: Diagnosis not present

## 2017-01-04 DIAGNOSIS — Z3A18 18 weeks gestation of pregnancy: Secondary | ICD-10-CM

## 2017-01-04 DIAGNOSIS — O26879 Cervical shortening, unspecified trimester: Secondary | ICD-10-CM

## 2017-01-04 DIAGNOSIS — O24414 Gestational diabetes mellitus in pregnancy, insulin controlled: Secondary | ICD-10-CM

## 2017-01-04 HISTORY — DX: Essential (primary) hypertension: I10

## 2017-01-04 NOTE — Addendum Note (Signed)
Encounter addended by: Heidi Dach, RN on: 01/04/2017 11:34 AM<BR>    Actions taken: Visit diagnoses modified, Diagnosis association updated, Order list changed

## 2017-01-04 NOTE — Addendum Note (Signed)
Encounter addended by: Raoul Pitch on: 01/04/2017 10:52 AM<BR>    Actions taken: Imaging Exam ended

## 2017-01-17 ENCOUNTER — Encounter (HOSPITAL_COMMUNITY): Payer: Self-pay

## 2017-01-25 ENCOUNTER — Ambulatory Visit (HOSPITAL_COMMUNITY)
Admission: RE | Admit: 2017-01-25 | Discharge: 2017-01-25 | Disposition: A | Discharge status: Home / Self Care | Payer: Medicaid Other | Source: Ambulatory Visit | Attending: Obstetrics and Gynecology | Admitting: Obstetrics and Gynecology

## 2017-01-25 ENCOUNTER — Encounter (HOSPITAL_COMMUNITY): Payer: Self-pay

## 2017-01-25 DIAGNOSIS — O24414 Gestational diabetes mellitus in pregnancy, insulin controlled: Secondary | ICD-10-CM | POA: Diagnosis present

## 2017-01-25 DIAGNOSIS — O26879 Cervical shortening, unspecified trimester: Secondary | ICD-10-CM | POA: Insufficient documentation

## 2017-01-25 DIAGNOSIS — Z3A19 19 weeks gestation of pregnancy: Secondary | ICD-10-CM | POA: Diagnosis not present

## 2017-01-29 ENCOUNTER — Other Ambulatory Visit (HOSPITAL_COMMUNITY): Payer: Self-pay | Admitting: *Deleted

## 2017-01-29 DIAGNOSIS — O24119 Pre-existing diabetes mellitus, type 2, in pregnancy, unspecified trimester: Secondary | ICD-10-CM

## 2017-01-29 DIAGNOSIS — O3432 Maternal care for cervical incompetence, second trimester: Secondary | ICD-10-CM

## 2017-02-08 ENCOUNTER — Ambulatory Visit (HOSPITAL_COMMUNITY)
Admission: RE | Admit: 2017-02-08 | Discharge: 2017-02-08 | Disposition: A | Discharge status: Home / Self Care | Payer: Medicaid Other | Source: Ambulatory Visit | Attending: Obstetrics and Gynecology | Admitting: Obstetrics and Gynecology

## 2017-02-08 ENCOUNTER — Encounter (HOSPITAL_COMMUNITY): Payer: Self-pay

## 2017-02-08 DIAGNOSIS — O3432 Maternal care for cervical incompetence, second trimester: Secondary | ICD-10-CM

## 2017-02-08 DIAGNOSIS — O24112 Pre-existing diabetes mellitus, type 2, in pregnancy, second trimester: Secondary | ICD-10-CM | POA: Diagnosis not present

## 2017-02-08 DIAGNOSIS — Z3A21 21 weeks gestation of pregnancy: Secondary | ICD-10-CM | POA: Diagnosis not present

## 2017-02-08 NOTE — ED Notes (Signed)
Pt here in [facility].  BP 143/77 left arm, 146/79 right arm.  Pt denies HA, visual changes, epigastric pain or swelling in low extremities. Good fetal movement.

## 2017-02-08 NOTE — Addendum Note (Signed)
Encounter addended by: Gurshan Settlemire M, RT on: 02/08/2017  3:27 PM<BR>    Actions taken: Imaging Exam ended

## 2017-02-22 ENCOUNTER — Ambulatory Visit (HOSPITAL_COMMUNITY): Admission: RE | Admit: 2017-02-22 | Payer: Medicaid Other | Source: Ambulatory Visit

## 2017-03-13 ENCOUNTER — Ambulatory Visit (HOSPITAL_COMMUNITY)
Admission: RE | Admit: 2017-03-13 | Discharge: 2017-03-13 | Disposition: A | Discharge status: Home / Self Care | Payer: Medicaid Other | Source: Ambulatory Visit | Attending: Obstetrics and Gynecology | Admitting: Obstetrics and Gynecology

## 2017-03-13 ENCOUNTER — Other Ambulatory Visit (HOSPITAL_COMMUNITY): Payer: Self-pay | Admitting: Maternal and Fetal Medicine

## 2017-03-13 ENCOUNTER — Encounter (HOSPITAL_COMMUNITY): Payer: Self-pay

## 2017-03-13 DIAGNOSIS — O99212 Obesity complicating pregnancy, second trimester: Secondary | ICD-10-CM | POA: Diagnosis not present

## 2017-03-13 DIAGNOSIS — Z6841 Body Mass Index (BMI) 40.0 and over, adult: Secondary | ICD-10-CM | POA: Insufficient documentation

## 2017-03-13 DIAGNOSIS — Z3A27 27 weeks gestation of pregnancy: Secondary | ICD-10-CM

## 2017-03-13 DIAGNOSIS — O3432 Maternal care for cervical incompetence, second trimester: Secondary | ICD-10-CM | POA: Diagnosis not present

## 2017-03-13 DIAGNOSIS — O24119 Pre-existing diabetes mellitus, type 2, in pregnancy, unspecified trimester: Secondary | ICD-10-CM

## 2017-03-13 DIAGNOSIS — Z794 Long term (current) use of insulin: Secondary | ICD-10-CM | POA: Insufficient documentation

## 2017-03-13 DIAGNOSIS — O24112 Pre-existing diabetes mellitus, type 2, in pregnancy, second trimester: Secondary | ICD-10-CM | POA: Diagnosis not present

## 2017-03-13 DIAGNOSIS — Z3A26 26 weeks gestation of pregnancy: Secondary | ICD-10-CM | POA: Diagnosis not present

## 2017-03-13 DIAGNOSIS — E669 Obesity, unspecified: Secondary | ICD-10-CM | POA: Insufficient documentation

## 2017-03-13 DIAGNOSIS — Z362 Encounter for other antenatal screening follow-up: Secondary | ICD-10-CM | POA: Diagnosis not present

## 2017-03-13 DIAGNOSIS — O10012 Pre-existing essential hypertension complicating pregnancy, second trimester: Secondary | ICD-10-CM | POA: Insufficient documentation

## 2017-05-21 LAB — OB RESULTS CONSOLE GBS: GBS: NEGATIVE

## 2017-05-31 ENCOUNTER — Telehealth (HOSPITAL_COMMUNITY): Payer: Self-pay | Admitting: *Deleted

## 2017-05-31 NOTE — Telephone Encounter (Signed)
Preadmission screen  

## 2017-06-07 ENCOUNTER — Encounter (HOSPITAL_COMMUNITY): Payer: Self-pay

## 2017-06-07 ENCOUNTER — Inpatient Hospital Stay (HOSPITAL_COMMUNITY)
Admission: AD | Admit: 2017-06-07 | Discharge: 2017-06-10 | DRG: 774 | Disposition: A | Discharge status: Home / Self Care | Payer: Medicaid Other | Source: Ambulatory Visit | Attending: Obstetrics and Gynecology | Admitting: Obstetrics and Gynecology

## 2017-06-07 ENCOUNTER — Other Ambulatory Visit: Payer: Self-pay | Admitting: Obstetrics and Gynecology

## 2017-06-07 DIAGNOSIS — O2412 Pre-existing diabetes mellitus, type 2, in childbirth: Principal | ICD-10-CM | POA: Diagnosis present

## 2017-06-07 DIAGNOSIS — O1002 Pre-existing essential hypertension complicating childbirth: Secondary | ICD-10-CM | POA: Diagnosis present

## 2017-06-07 DIAGNOSIS — O411230 Chorioamnionitis, third trimester, not applicable or unspecified: Secondary | ICD-10-CM | POA: Diagnosis present

## 2017-06-07 DIAGNOSIS — Z87891 Personal history of nicotine dependence: Secondary | ICD-10-CM | POA: Diagnosis not present

## 2017-06-07 DIAGNOSIS — Z3A38 38 weeks gestation of pregnancy: Secondary | ICD-10-CM

## 2017-06-07 DIAGNOSIS — O99214 Obesity complicating childbirth: Secondary | ICD-10-CM | POA: Diagnosis present

## 2017-06-07 DIAGNOSIS — E1165 Type 2 diabetes mellitus with hyperglycemia: Secondary | ICD-10-CM | POA: Diagnosis present

## 2017-06-07 DIAGNOSIS — E119 Type 2 diabetes mellitus without complications: Secondary | ICD-10-CM | POA: Diagnosis present

## 2017-06-07 DIAGNOSIS — Z794 Long term (current) use of insulin: Secondary | ICD-10-CM | POA: Diagnosis not present

## 2017-06-07 DIAGNOSIS — IMO0002 Reserved for concepts with insufficient information to code with codable children: Secondary | ICD-10-CM | POA: Diagnosis present

## 2017-06-07 DIAGNOSIS — O3433 Maternal care for cervical incompetence, third trimester: Secondary | ICD-10-CM | POA: Diagnosis present

## 2017-06-07 DIAGNOSIS — Z91040 Latex allergy status: Secondary | ICD-10-CM

## 2017-06-07 DIAGNOSIS — Z6841 Body Mass Index (BMI) 40.0 and over, adult: Secondary | ICD-10-CM

## 2017-06-07 DIAGNOSIS — O10919 Unspecified pre-existing hypertension complicating pregnancy, unspecified trimester: Secondary | ICD-10-CM | POA: Diagnosis present

## 2017-06-07 LAB — GLUCOSE, CAPILLARY
GLUCOSE-CAPILLARY: 131 mg/dL — AB (ref 65–99)
GLUCOSE-CAPILLARY: 135 mg/dL — AB (ref 65–99)
GLUCOSE-CAPILLARY: 137 mg/dL — AB (ref 65–99)
Glucose-Capillary: 187 mg/dL — ABNORMAL HIGH (ref 65–99)

## 2017-06-07 LAB — COMPREHENSIVE METABOLIC PANEL
ALK PHOS: 118 U/L (ref 38–126)
ALT: 23 U/L (ref 14–54)
ANION GAP: 11 (ref 5–15)
AST: 29 U/L (ref 15–41)
Albumin: 2.9 g/dL — ABNORMAL LOW (ref 3.5–5.0)
BUN: 7 mg/dL (ref 6–20)
CALCIUM: 9.5 mg/dL (ref 8.9–10.3)
CO2: 19 mmol/L — AB (ref 22–32)
Chloride: 103 mmol/L (ref 101–111)
Creatinine, Ser: 0.6 mg/dL (ref 0.44–1.00)
GFR calc non Af Amer: >60 mL/min (ref >60)
Glucose, Bld: 192 mg/dL — ABNORMAL HIGH (ref 65–99)
Potassium: 4.4 mmol/L (ref 3.5–5.1)
SODIUM: 133 mmol/L — AB (ref 135–145)
Total Bilirubin: 0.4 mg/dL (ref 0.3–1.2)
Total Protein: 6.4 g/dL — ABNORMAL LOW (ref 6.5–8.1)

## 2017-06-07 LAB — CBC
HCT: 36.9 % (ref 36.0–46.0)
HEMOGLOBIN: 12.4 g/dL (ref 12.0–15.0)
MCH: 28.3 pg (ref 26.0–34.0)
MCHC: 33.6 g/dL (ref 30.0–36.0)
MCV: 84.2 fL (ref 78.0–100.0)
Platelets: 401 10*3/uL — ABNORMAL HIGH (ref 150–400)
RBC: 4.38 MIL/uL (ref 3.87–5.11)
RDW: 13.8 % (ref 11.5–15.5)
WBC: 11.5 10*3/uL — ABNORMAL HIGH (ref 4.0–10.5)

## 2017-06-07 LAB — TYPE AND SCREEN
ABO/RH(D): A POS
Antibody Screen: NEGATIVE

## 2017-06-07 LAB — PROTEIN / CREATININE RATIO, URINE
CREATININE, URINE: 252 mg/dL
Protein Creatinine Ratio: 0.05 mg/mg{Cre} (ref 0.00–0.15)
TOTAL PROTEIN, URINE: 12 mg/dL

## 2017-06-07 LAB — ABO/RH: ABO/RH(D): A POS

## 2017-06-07 LAB — LACTATE DEHYDROGENASE: LDH: 149 U/L (ref 98–192)

## 2017-06-07 LAB — URIC ACID: Uric Acid, Serum: 5.1 mg/dL (ref 2.3–6.6)

## 2017-06-07 MED ORDER — BUTORPHANOL TARTRATE 1 MG/ML IJ SOLN
1.0000 mg | INTRAMUSCULAR | Status: DC | PRN
  Administered 2017-06-08: 1 mg via INTRAVENOUS
  Filled 2017-06-07: qty 1

## 2017-06-07 MED ORDER — ONDANSETRON HCL 4 MG/2ML IJ SOLN: 4.0000 mg | Freq: Four times a day (QID) | INTRAMUSCULAR | Status: DC | PRN

## 2017-06-07 MED ORDER — SOD CITRATE-CITRIC ACID 500-334 MG/5ML PO SOLN: 30.0000 mL | ORAL | Status: DC | PRN

## 2017-06-07 MED ORDER — LABETALOL HCL 5 MG/ML IV SOLN
20.0000 mg | INTRAVENOUS | Status: AC | PRN
  Administered 2017-06-07: 40 mg via INTRAVENOUS
  Administered 2017-06-07: 20 mg via INTRAVENOUS
  Filled 2017-06-07: qty 4
  Filled 2017-06-07: qty 8

## 2017-06-07 MED ORDER — NIFEDIPINE ER OSMOTIC RELEASE 30 MG PO TB24
60.0000 mg | ORAL_TABLET | Freq: Every day | ORAL | Status: DC
Start: 2017-06-08 — End: 2017-06-09
  Administered 2017-06-08: 60 mg via ORAL
  Filled 2017-06-07: qty 2

## 2017-06-07 MED ORDER — HYDRALAZINE HCL 20 MG/ML IJ SOLN
5.0000 mg | INTRAMUSCULAR | Status: AC | PRN
  Administered 2017-06-07: 5 mg via INTRAVENOUS
  Administered 2017-06-07: 10 mg via INTRAVENOUS
  Filled 2017-06-07: qty 1

## 2017-06-07 MED ORDER — TERBUTALINE SULFATE 1 MG/ML IJ SOLN: 0.2500 mg | Freq: Once | INTRAMUSCULAR | Status: DC | PRN

## 2017-06-07 MED ORDER — LACTATED RINGERS IV SOLN
INTRAVENOUS | Status: DC
  Administered 2017-06-07 – 2017-06-08 (×3): via INTRAVENOUS

## 2017-06-07 MED ORDER — LACTATED RINGERS IV SOLN: 500.0000 mL | INTRAVENOUS | Status: DC | PRN

## 2017-06-07 MED ORDER — NIFEDIPINE ER OSMOTIC RELEASE 30 MG PO TB24
30.0000 mg | ORAL_TABLET | Freq: Every day | ORAL | Status: DC
Start: 2017-06-08 — End: 2017-06-07

## 2017-06-07 MED ORDER — OXYTOCIN BOLUS FROM INFUSION
500.0000 mL | Freq: Once | INTRAVENOUS | Status: AC
  Administered 2017-06-08: 500 mL via INTRAVENOUS

## 2017-06-07 MED ORDER — OXYTOCIN 40 UNITS IN LACTATED RINGERS INFUSION - SIMPLE MED
1.0000 m[IU]/min | INTRAVENOUS | Status: DC
  Administered 2017-06-07: 1 m[IU]/min via INTRAVENOUS
  Filled 2017-06-07: qty 1000

## 2017-06-07 MED ORDER — SODIUM CHLORIDE 0.9 % IV SOLN
INTRAVENOUS | Status: DC
  Administered 2017-06-07: 1.3 [IU]/h via INTRAVENOUS
  Filled 2017-06-07: qty 1

## 2017-06-07 MED ORDER — LABETALOL HCL 200 MG PO TABS
200.0000 mg | ORAL_TABLET | Freq: Two times a day (BID) | ORAL | Status: DC
  Administered 2017-06-07 – 2017-06-08 (×3): 200 mg via ORAL
  Filled 2017-06-07 (×3): qty 1

## 2017-06-07 MED ORDER — LIDOCAINE HCL (PF) 1 % IJ SOLN
30.0000 mL | INTRAMUSCULAR | Status: DC | PRN
  Administered 2017-06-08: 30 mL via SUBCUTANEOUS
  Filled 2017-06-07: qty 30

## 2017-06-07 MED ORDER — HYDROXYZINE HCL 50 MG PO TABS
50.0000 mg | ORAL_TABLET | Freq: Four times a day (QID) | ORAL | Status: DC | PRN
  Filled 2017-06-07: qty 1

## 2017-06-07 MED ORDER — TERBUTALINE SULFATE 1 MG/ML IJ SOLN
0.2500 mg | Freq: Once | INTRAMUSCULAR | Status: DC | PRN
  Filled 2017-06-07: qty 1

## 2017-06-07 MED ORDER — OXYTOCIN 40 UNITS IN LACTATED RINGERS INFUSION - SIMPLE MED: 2.5000 [IU]/h | INTRAVENOUS | Status: DC

## 2017-06-07 MED ORDER — ACETAMINOPHEN 325 MG PO TABS
650.0000 mg | ORAL_TABLET | ORAL | Status: DC | PRN
  Administered 2017-06-08 (×2): 650 mg via ORAL
  Filled 2017-06-07 (×2): qty 2

## 2017-06-07 MED ORDER — OXYCODONE-ACETAMINOPHEN 5-325 MG PO TABS: 1.0000 | ORAL_TABLET | ORAL | Status: DC | PRN

## 2017-06-07 MED ORDER — OXYCODONE-ACETAMINOPHEN 5-325 MG PO TABS: 2.0000 | ORAL_TABLET | ORAL | Status: DC | PRN

## 2017-06-07 NOTE — Progress Notes (Signed)
BLESSIN KANNO MRN: [id number]  Subjective: -Care assumed of 26 y.o. G1P0 at [redacted]w[redacted]d who presents for elevated blood pressures. Patient is under the care of [facility] and is attended by Dr. Delrae Sawyers.  Pregnancy and medical history significant for Obesity, CHTN on medication, Cervical insufficiency with rescue cerclage at 17 weeks, and DMT2 on insulin. In room to meet acquaintance of patient and family.  Patient denies current HA, visual disturbances, right upper quadrant pain, and reports mild discomfort with contractions.    Objective: BP (!) 163/93   Pulse (!) 104   Temp 98.2 F (36.8 C) (Oral)   Resp 16   Ht  (1.651 m)   Wt 122 kg (269 lb)   LMP 08/31/2016   BMI 44.76 kg/m  No intake/output data recorded. No intake/output data recorded.  Fetal Monitoring: FHT: 145 bpm, Mod Var, -Decels, +Accels UC: Q90min    Physical Exam: General appearance: alert, well appearing, and in no distress. Chest: normal rate and regular rhythm.  clear to auscultation, no wheezes, rales or rhonchi, symmetric air entry. Abdominal exam: Soft, NT, Gravid, +BS. Extremities: +2 Pitting Edema in BLE, No clonus, +2 Reflexes Skin exam: Warm Dry  Vaginal Exam: SVE:   Dilation: 2.5 Effacement (%): 50 Station: -3 Exam by:: Mary [place] Johnson, RN  Membranes:Intact Internal Monitors: None  Augmentation/Induction: Pitocin:Initiated Cytotec: None  Assessment:  IUP at 38.6wks Cat I FT  CHTN DM-T2 Obesity Labor Augmentation  Plan: -Currently on Glucometer-CBG 187 -Given 2 Doses of Hydralazine with minimal changes -Discussed initiation of pitocin to start labor augmentation process -Discussed r/b of pitocin including fetal intolerance, tachysystole, as well as stronger contractions and decreased labor time -Dr. Audree Camel updated and advised *Start PO labetalol  Q12 hrs *Hold Labetalol if </=120/80 *Agrees with pitocin initiation -Continue other mgmt as ordered  Valma Cava, CNM 06/07/2017, 8:33 PM

## 2017-06-07 NOTE — Anesthesia Pain Management Evaluation Note (Signed)
  CRNA Pain Management Visit Note  Patient: Megan Ibarra, 26 y.o., female  "Hello I am a member of the anesthesia team at [facility]. We have an anesthesia team available at all times to provide care throughout the hospital, including epidural management and anesthesia for C-section. I don't know your plan for the delivery whether it a natural birth, water birth, IV sedation, nitrous supplementation, doula or epidural, but we want to meet your pain goals."   1.Was your pain managed to your expectations on prior hospitalizations?   No prior hospitalizations  2.What is your expectation for pain management during this hospitalization?     Epidural  3.How can we help you reach that goal? epidural  Record the patient's initial score and the patient's pain goal.   Pain: 0  Pain Goal: 5 [facility] wants you to be able to say your pain was always managed very well.  Kahari Critzer 06/07/2017

## 2017-06-08 ENCOUNTER — Encounter (HOSPITAL_COMMUNITY): Payer: Self-pay | Admitting: Anesthesiology

## 2017-06-08 ENCOUNTER — Inpatient Hospital Stay (HOSPITAL_COMMUNITY): Payer: Medicaid Other | Admitting: Anesthesiology

## 2017-06-08 DIAGNOSIS — O10919 Unspecified pre-existing hypertension complicating pregnancy, unspecified trimester: Secondary | ICD-10-CM | POA: Diagnosis present

## 2017-06-08 LAB — CBC
HCT: 33.3 % — ABNORMAL LOW (ref 36.0–46.0)
HCT: 35.3 % — ABNORMAL LOW (ref 36.0–46.0)
HEMOGLOBIN: 11.5 g/dL — AB (ref 12.0–15.0)
HEMOGLOBIN: 11.9 g/dL — AB (ref 12.0–15.0)
MCH: 28.9 pg (ref 26.0–34.0)
MCH: 28.5 pg (ref 26.0–34.0)
MCHC: 34.5 g/dL (ref 30.0–36.0)
MCHC: 33.7 g/dL (ref 30.0–36.0)
MCV: 83.7 fL (ref 78.0–100.0)
MCV: 84.4 fL (ref 78.0–100.0)
PLATELETS: 332 10*3/uL (ref 150–400)
PLATELETS: 363 10*3/uL (ref 150–400)
RBC: 3.98 MIL/uL (ref 3.87–5.11)
RBC: 4.18 MIL/uL (ref 3.87–5.11)
RDW: 14 % (ref 11.5–15.5)
RDW: 14 % (ref 11.5–15.5)
WBC: 11.4 10*3/uL — AB (ref 4.0–10.5)
WBC: 19 10*3/uL — ABNORMAL HIGH (ref 4.0–10.5)

## 2017-06-08 LAB — GLUCOSE, CAPILLARY
GLUCOSE-CAPILLARY: 103 mg/dL — AB (ref 65–99)
GLUCOSE-CAPILLARY: 133 mg/dL — AB (ref 65–99)
GLUCOSE-CAPILLARY: 88 mg/dL (ref 65–99)
GLUCOSE-CAPILLARY: 89 mg/dL (ref 65–99)
GLUCOSE-CAPILLARY: 102 mg/dL — AB (ref 65–99)
GLUCOSE-CAPILLARY: 99 mg/dL (ref 65–99)
GLUCOSE-CAPILLARY: 98 mg/dL (ref 65–99)
GLUCOSE-CAPILLARY: 103 mg/dL — AB (ref 65–99)
Glucose-Capillary: 101 mg/dL — ABNORMAL HIGH (ref 65–99)
Glucose-Capillary: 112 mg/dL — ABNORMAL HIGH (ref 65–99)
Glucose-Capillary: 121 mg/dL — ABNORMAL HIGH (ref 65–99)
Glucose-Capillary: 123 mg/dL — ABNORMAL HIGH (ref 65–99)
Glucose-Capillary: 130 mg/dL — ABNORMAL HIGH (ref 65–99)
Glucose-Capillary: 85 mg/dL (ref 65–99)
Glucose-Capillary: 94 mg/dL (ref 65–99)
Glucose-Capillary: 97 mg/dL (ref 65–99)
Glucose-Capillary: 98 mg/dL (ref 65–99)
Glucose-Capillary: 99 mg/dL (ref 65–99)
Glucose-Capillary: 99 mg/dL (ref 65–99)

## 2017-06-08 LAB — URINALYSIS, ROUTINE W REFLEX MICROSCOPIC
Bilirubin Urine: NEGATIVE
GLUCOSE, UA: NEGATIVE mg/dL
Ketones, ur: NEGATIVE mg/dL
NITRITE: NEGATIVE
PH: 5 (ref 5.0–8.0)
Protein, ur: 30 mg/dL — AB
SPECIFIC GRAVITY, URINE: 1.018 (ref 1.005–1.030)

## 2017-06-08 LAB — RPR: RPR Ser Ql: NONREACTIVE

## 2017-06-08 MED ORDER — DIPHENHYDRAMINE HCL 50 MG/ML IJ SOLN: 12.5000 mg | INTRAMUSCULAR | Status: DC | PRN

## 2017-06-08 MED ORDER — PHENYLEPHRINE 40 MCG/ML (10ML) SYRINGE FOR IV PUSH (FOR BLOOD PRESSURE SUPPORT)
PREFILLED_SYRINGE | INTRAVENOUS | Status: AC
  Filled 2017-06-08: qty 10

## 2017-06-08 MED ORDER — FENTANYL 2.5 MCG/ML BUPIVACAINE 1/10 % EPIDURAL INFUSION (WH - ANES)
INTRAMUSCULAR | Status: AC
  Filled 2017-06-08: qty 100

## 2017-06-08 MED ORDER — SODIUM CHLORIDE 0.9 % IV SOLN
3.0000 g | Freq: Four times a day (QID) | INTRAVENOUS | Status: AC
  Administered 2017-06-09 (×3): 3 g via INTRAVENOUS
  Filled 2017-06-08 (×4): qty 3

## 2017-06-08 MED ORDER — SENNOSIDES-DOCUSATE SODIUM 8.6-50 MG PO TABS
2.0000 | ORAL_TABLET | ORAL | Status: DC
  Administered 2017-06-09 – 2017-06-10 (×2): 2 via ORAL
  Filled 2017-06-08 (×2): qty 2

## 2017-06-08 MED ORDER — LABETALOL HCL 5 MG/ML IV SOLN
20.0000 mg | INTRAVENOUS | Status: DC | PRN
  Administered 2017-06-08 (×2): 20 mg via INTRAVENOUS
  Filled 2017-06-08: qty 4

## 2017-06-08 MED ORDER — PHENYLEPHRINE 40 MCG/ML (10ML) SYRINGE FOR IV PUSH (FOR BLOOD PRESSURE SUPPORT)
80.0000 ug | PREFILLED_SYRINGE | INTRAVENOUS | Status: DC | PRN
  Filled 2017-06-08: qty 5

## 2017-06-08 MED ORDER — COCONUT OIL OIL: 1.0000 "application " | TOPICAL_OIL | Status: DC | PRN

## 2017-06-08 MED ORDER — TETANUS-DIPHTH-ACELL PERTUSSIS 5-2.5-18.5 LF-MCG/0.5 IM SUSP: 0.5000 mL | Freq: Once | INTRAMUSCULAR | Status: DC

## 2017-06-08 MED ORDER — OXYTOCIN 40 UNITS IN LACTATED RINGERS INFUSION - SIMPLE MED: 1.0000 m[IU]/min | INTRAVENOUS | Status: DC

## 2017-06-08 MED ORDER — IBUPROFEN 600 MG PO TABS
600.0000 mg | ORAL_TABLET | Freq: Four times a day (QID) | ORAL | Status: DC
  Administered 2017-06-09 – 2017-06-10 (×7): 600 mg via ORAL
  Filled 2017-06-08 (×7): qty 1

## 2017-06-08 MED ORDER — EPHEDRINE 5 MG/ML INJ
10.0000 mg | INTRAVENOUS | Status: DC | PRN
  Filled 2017-06-08: qty 2

## 2017-06-08 MED ORDER — DEXTROSE IN LACTATED RINGERS 5 % IV SOLN
INTRAVENOUS | Status: DC
  Administered 2017-06-08: 03:00:00 via INTRAVENOUS
  Administered 2017-06-08: 75 mL via INTRAVENOUS

## 2017-06-08 MED ORDER — LABETALOL HCL 5 MG/ML IV SOLN
INTRAVENOUS | Status: AC
  Filled 2017-06-08: qty 4

## 2017-06-08 MED ORDER — LACTATED RINGERS IV SOLN: 500.0000 mL | Freq: Once | INTRAVENOUS | Status: DC

## 2017-06-08 MED ORDER — ACETAMINOPHEN 325 MG PO TABS: 650.0000 mg | ORAL_TABLET | Freq: Four times a day (QID) | ORAL | Status: DC | PRN

## 2017-06-08 MED ORDER — DIBUCAINE 1 % RE OINT: 1.0000 "application " | TOPICAL_OINTMENT | RECTAL | Status: DC | PRN

## 2017-06-08 MED ORDER — ONDANSETRON HCL 4 MG PO TABS: 4.0000 mg | ORAL_TABLET | ORAL | Status: DC | PRN

## 2017-06-08 MED ORDER — ACETAMINOPHEN 325 MG PO TABS
650.0000 mg | ORAL_TABLET | ORAL | Status: DC | PRN
  Administered 2017-06-09: 650 mg via ORAL
  Filled 2017-06-08: qty 2

## 2017-06-08 MED ORDER — BENZOCAINE-MENTHOL 20-0.5 % EX AERO
1.0000 "application " | INHALATION_SPRAY | CUTANEOUS | Status: DC | PRN
  Administered 2017-06-09: 1 via TOPICAL
  Filled 2017-06-08: qty 56

## 2017-06-08 MED ORDER — SIMETHICONE 80 MG PO CHEW: 80.0000 mg | CHEWABLE_TABLET | ORAL | Status: DC | PRN

## 2017-06-08 MED ORDER — ONDANSETRON HCL 4 MG/2ML IJ SOLN: 4.0000 mg | INTRAMUSCULAR | Status: DC | PRN

## 2017-06-08 MED ORDER — FENTANYL 2.5 MCG/ML BUPIVACAINE 1/10 % EPIDURAL INFUSION (WH - ANES)
14.0000 mL/h | INTRAMUSCULAR | Status: DC | PRN
  Administered 2017-06-08 (×4): 14 mL/h via EPIDURAL
  Filled 2017-06-08 (×2): qty 100

## 2017-06-08 MED ORDER — HYDRALAZINE HCL 20 MG/ML IJ SOLN: 10.0000 mg | Freq: Once | INTRAMUSCULAR | Status: DC | PRN

## 2017-06-08 MED ORDER — WITCH HAZEL-GLYCERIN EX PADS
1.0000 "application " | MEDICATED_PAD | CUTANEOUS | Status: DC | PRN
  Administered 2017-06-09: 1 via TOPICAL

## 2017-06-08 MED ORDER — DIPHENHYDRAMINE HCL 25 MG PO CAPS: 25.0000 mg | ORAL_CAPSULE | Freq: Four times a day (QID) | ORAL | Status: DC | PRN

## 2017-06-08 MED ORDER — ZOLPIDEM TARTRATE 5 MG PO TABS: 5.0000 mg | ORAL_TABLET | Freq: Every evening | ORAL | Status: DC | PRN

## 2017-06-08 MED ORDER — SODIUM BICARBONATE 8.4 % IV SOLN
INTRAVENOUS | Status: DC | PRN
  Administered 2017-06-08: 8 mL via EPIDURAL

## 2017-06-08 MED ORDER — PRENATAL MULTIVITAMIN CH
1.0000 | ORAL_TABLET | Freq: Every day | ORAL | Status: DC
  Administered 2017-06-09 – 2017-06-10 (×2): 1 via ORAL
  Filled 2017-06-08 (×2): qty 1

## 2017-06-08 MED ORDER — SODIUM CHLORIDE 0.9 % IV SOLN
3.0000 g | Freq: Four times a day (QID) | INTRAVENOUS | Status: DC
  Administered 2017-06-08 (×3): 3 g via INTRAVENOUS
  Filled 2017-06-08 (×4): qty 3

## 2017-06-08 NOTE — Progress Notes (Addendum)
Megan Ibarra [id number]  Subjective: Nurse reports advancement in dilation.  Patient calls out and reports SROM of clear fluid.  Strip and Chart Reviewed.  Objective:  Vitals:   06/08/17 0000 06/08/17 0030 06/08/17 0100 06/08/17 0136  BP: (!) 151/89 (!) 157/81 140/63 (!) 152/90  Pulse: (!) 103 (!) 101 (!) 102 (!) 102  Resp:  17  18  Temp:      TempSrc:      Weight:      Height:        FHR: 155 bpm, Mod Var, -Decels, -Accels UC: None Graphed  Dilation: 4 Effacement (%): 80 Cervical Position: Middle Station: -2 Presentation: Vertex Exam by:: B Boyer RN   Pitocin at 51mUn/min  Assessment: IUP at [redacted]w[redacted]d Cat 1 FT Labor Augmentation CHTN DM-T2; Insulin Mgmt  Plan: -Okay for epidural when desired -BP Stable -CBG below 140, will change fluid to D5 -Continue other mgmt as ordered  Sabas Sous, CNM 06/08/2017 2:04 AM  Addendum Nurse reports patient has pitocin, insulin, and fluids infusing through the same line.  Instructed to discontinue pitocin at current. Attempt to place another line  Will update Dr. ND at 0630  Cherre Robins MSN, CNM 3:35 AM

## 2017-06-08 NOTE — Progress Notes (Signed)
Megan Ibarra MRN: [id number]  Subjective: -Care assumed.  Patient resting in bed.  Reports constant rectal pressure with an urge to push with contractions. Patient expresses desire to learn pushing techniques and position to promote quick and optimal delivery.  Objective: BP (!) 155/92   Pulse (!) 117   Temp 98.7 F (37.1 C) (Oral)   Resp 20   Ht  (1.651 m)   Wt 122 kg (269 lb)   LMP 08/31/2016   SpO2 100%   BMI 44.76 kg/m  I/O last 3 completed shifts: In: -  Out: 325 [Urine:325] No intake/output data recorded.  Fetal Monitoring: FHT: 140 bpm, Mod Var, +Early Decels, +Accels UC: palpates strong    Vaginal Exam: SVE:   Dilation: 10 Effacement (%): 100 Station: +1, +2 Exam by:: Sabas Sous, cnm Membranes:AROM Internal Monitors: FSE   Augmentation/Induction: Pitocin:42mUn/min Cytotec: None  Assessment:  IUP at 39wks Cat I FT  IOL Chorioamnionitis 2nd Stage Labor  Plan: -Start pushing -Anticipate SVD -Continue other mgmt as ordered   Valma Cava, CNM 06/08/2017, 8:18 PM

## 2017-06-08 NOTE — Anesthesia Procedure Notes (Signed)
Epidural Patient location during procedure: OB Start time: 06/08/2017 4:27 AM  Staffing Anesthesiologist: Odette Fraction Performed: anesthesiologist   Preanesthetic Checklist Completed: patient identified, site marked, surgical consent, pre-op evaluation, timeout performed, IV checked, risks and benefits discussed and monitors and equipment checked  Epidural Patient position: sitting Prep: DuraPrep Patient monitoring: heart rate, continuous pulse ox and blood pressure Location: L3-L4 Injection technique: LOR saline  Needle:  Needle type: Tuohy  Needle gauge: 17 G Needle length: 15 cm and 15 Needle insertion depth: 11 cm Catheter type: closed end flexible Catheter size: 20 Guage Catheter at skin depth: 15 cm Test dose: negative and 2% lidocaine with Epi 1:200 K  Assessment Events: blood not aspirated, injection not painful, no injection resistance, negative IV test and no paresthesia  Additional Notes Patient identified. Risks/Benefits/Options discussed with patient including but not limited to bleeding, infection, nerve damage, paralysis, failed block, incomplete pain control, headache, blood pressure changes, nausea, vomiting, reactions to medication both or allergic, itching and postpartum back pain. Confirmed with bedside nurse the patient's most recent platelet count. Confirmed with patient that they are not currently taking any anticoagulation, have any bleeding history or any family history of bleeding disorders. Patient expressed understanding and wished to proceed. All questions were answered. Sterile technique was used throughout the entire procedure. Please see nursing notes for vital signs. Test dose was given through epidural needle and negative prior to continuing to dose epidural or start infusion.    All questions and concerns addressed with instructions to call with any issues.Reason for block:procedure for pain

## 2017-06-08 NOTE — Progress Notes (Signed)
Pt comfortable denies headache or blurred vision BP (!) 169/89   Pulse (!) 111   Temp 99.4 F (37.4 C)   Resp 18   Ht  (1.651 m)   Wt 122 kg (269 lb)   LMP 08/31/2016   SpO2 100%   BMI 44.76 kg/m  Cat 1 NST toco q 2-3 minutes cx 5/90/-1 Pt given second IV access because of multiple meds\ She is receiving IV labetalol, PO labetalol and procardia to control BP.  PIH labs WNL Glucose stabilizer to control blood sugars  will monitor closely.   Anticipate SVD

## 2017-06-08 NOTE — Anesthesia Preprocedure Evaluation (Signed)
Anesthesia Evaluation  Patient identified by MRN, date of birth, ID band Patient awake    History of Anesthesia Complications Negative for: history of anesthetic complications  Airway Mallampati: II  TM Distance: >3 FB Neck ROM: Full    Dental no notable dental hx.    Pulmonary neg COPD, neg recent URI, former smoker,    Pulmonary exam normal        Cardiovascular hypertension, Pt. on medications (-) Cardiac Stents, (-) CABG, (-) CHF and (-) DVT Normal cardiovascular exam     Neuro/Psych negative neurological ROS  negative psych ROS   GI/Hepatic negative GI ROS, Neg liver ROS,   Endo/Other  diabetes, Poorly Controlled, Insulin Dependent  Renal/GU negative Renal ROS  negative genitourinary   Musculoskeletal negative musculoskeletal ROS (+)   Abdominal   Peds negative pediatric ROS (+)  Hematology   Anesthesia Other Findings   Reproductive/Obstetrics (+) Pregnancy                             Anesthesia Physical Anesthesia Plan  ASA: III  Anesthesia Plan: Epidural   Post-op Pain Management:    Induction:   PONV Risk Score and Plan:   Airway Management Planned:   Additional Equipment:   Intra-op Plan:   Post-operative Plan:   Informed Consent: I have reviewed the patients History and Physical, chart, labs and discussed the procedure including the risks, benefits and alternatives for the proposed anesthesia with the patient or authorized representative who has indicated his/her understanding and acceptance.     Plan Discussed with:   Anesthesia Plan Comments:         Anesthesia Quick Evaluation

## 2017-06-08 NOTE — Progress Notes (Signed)
Pt comfortable BP (!) 153/90   Pulse (!) 102   Temp 100.2 F (37.9 C) (Axillary)   Resp 18   Ht  (1.651 m)   Wt 122 kg (269 lb)   LMP 08/31/2016   SpO2 100%   BMI 44.76 kg/m  Cat 2 NST pt with some early decelerations tocor q 2 minutes 6/90/-2 Mild meconium unasyn for chorioamnionitis FSE applied tyenol for fever Monitor closely

## 2017-06-09 LAB — CBC
HEMATOCRIT: 30.9 % — AB (ref 36.0–46.0)
Hemoglobin: 10.5 g/dL — ABNORMAL LOW (ref 12.0–15.0)
MCH: 28.8 pg (ref 26.0–34.0)
MCHC: 34 g/dL (ref 30.0–36.0)
MCV: 84.7 fL (ref 78.0–100.0)
Platelets: 319 10*3/uL (ref 150–400)
RBC: 3.65 MIL/uL — ABNORMAL LOW (ref 3.87–5.11)
RDW: 14.3 % (ref 11.5–15.5)
WBC: 17 10*3/uL — ABNORMAL HIGH (ref 4.0–10.5)

## 2017-06-09 LAB — GLUCOSE, CAPILLARY
GLUCOSE-CAPILLARY: 104 mg/dL — AB (ref 65–99)
GLUCOSE-CAPILLARY: 87 mg/dL (ref 65–99)
Glucose-Capillary: 107 mg/dL — ABNORMAL HIGH (ref 65–99)
Glucose-Capillary: 91 mg/dL (ref 65–99)

## 2017-06-09 MED ORDER — LABETALOL HCL 5 MG/ML IV SOLN
10.0000 mg | INTRAVENOUS | Status: DC | PRN
  Administered 2017-06-09: 10 mg via INTRAVENOUS
  Filled 2017-06-09: qty 4

## 2017-06-09 MED ORDER — NIFEDIPINE ER OSMOTIC RELEASE 30 MG PO TB24
90.0000 mg | ORAL_TABLET | Freq: Every day | ORAL | Status: DC
  Filled 2017-06-09: qty 3

## 2017-06-09 MED ORDER — METFORMIN HCL 500 MG PO TABS
500.0000 mg | ORAL_TABLET | Freq: Two times a day (BID) | ORAL | Status: DC
  Administered 2017-06-09 – 2017-06-10 (×3): 500 mg via ORAL
  Filled 2017-06-09 (×5): qty 1

## 2017-06-09 MED ORDER — LABETALOL HCL 200 MG PO TABS
200.0000 mg | ORAL_TABLET | Freq: Three times a day (TID) | ORAL | Status: DC
  Filled 2017-06-09: qty 1

## 2017-06-09 NOTE — Progress Notes (Signed)
Megan Ibarra  Post Partum Day 1:S/P SVD with repair of Left Sulcus Laceration  Subjective: Patient up ad lib, denies syncope or dizziness. Reports consuming regular diet without issues and denies N/V. Denies issues with urination and reports bleeding is "good."  Patient is breastfeeding and reports some issues with production and is supplementing.  Desires for postpartum contraception not addressed.  Pain is being appropriately managed with use of ibuprofen.  Objective: Vitals:   06/09/17 0005 06/09/17 0127 06/09/17 0229 06/09/17 0410  BP: (!) 161/78 (!) 160/72 (!) 142/65 (!) 149/76  Pulse: (!) 104 100 (!) 104 (!) 102  Resp: 20   20  Temp: 98.7 F (37.1 C)   98.1 F (36.7 C)  TempSrc:    Oral  SpO2:      Weight:      Height:        Recent Labs  06/08/17 2300 06/09/17 0505  HGB 11.9* 10.5*  HCT 35.3* 30.9*    Physical Exam:  General: alert, cooperative and no distress Mood/Affect: Appropriate/Bright Lungs: clear to auscultation, no wheezes, rales or rhonchi, symmetric air entry.  Heart: normal rate and regular rhythm. Breast: not examined. Abdomen:  + bowel sounds, Soft, NT Uterine Fundus: firm, U/-1 Lochia: appropriate Laceration: Not Assessed Skin: Warm, Dry DVT Evaluation: Calf/Ankle edema is present.  Assessment S/P Vaginal Delivery-Day 1 Normal Involution BreastFeeding Female Infant  Plan: Lactation Consult Encouraged rest Infant to have Circumcision tomorrow with Deboraha Sprang MDs Plan for discharge tomorrow Continue current care Dr. ND to be updated on patient status   Cherre Robins, MSN, CNM 06/09/2017, 7:17 AM

## 2017-06-09 NOTE — Lactation Note (Signed)
This note was copied from a baby's chart. Lactation Consultation Note  Patient Name: Megan Ibarra ZOXWR'U Date: 06/09/2017 Reason for consult: Follow-up assessment Baby at 22 hr of life. Upon entry baby was sleeping in visitor's arms. Mom reports even with the NS baby is having a hard time latching. She reports "it takes a little for him to get the bottle but once he starts he eats real good". She was very excited she was able to pump a volume to feed today. Discussed baby behavior, feeding frequency, baby belly size, voids, wt loss, breast changes, and nipple care. Mom is aware of lactation services and support group. She will offer the breast on demand, post express, and supplement per volume guidelines.     Maternal Data    Feeding Feeding Type: Bottle Fed - Formula Nipple Type: Slow - flow Length of feed: 0 min  LATCH Score Latch: Repeated attempts needed to sustain latch, nipple held in mouth throughout feeding, stimulation needed to elicit sucking reflex.  Audible Swallowing: None  Type of Nipple: Flat (20 nipple shield)  Comfort (Breast/Nipple): Soft / non-tender  Hold (Positioning): Assistance needed to correctly position infant at breast and maintain latch.  LATCH Score: 5  Interventions Interventions: Breast feeding basics reviewed;Assisted with latch;Skin to skin;Hand express;Adjust position;Support pillows;Position options;Expressed milk;DEBP;Hand pump  Lactation Tools Discussed/Used Tools: Pump;Flanges Nipple shield size: 20 Flange Size: 21 (24 slightly big, mom to try 21, but will go back to 24 if 21 too tight) Breast pump type: Double-Electric Breast Pump WIC Program:  (mom to call them tomorrow, let them know the baby ws born) Pump Review: Setup, frequency, and cleaning;Other (comment) (pump settings)   Consult Status Consult Status: Follow-up Date: 06/10/17 Follow-up type: In-patient    Megan Ibarra 06/09/2017, 6:59 PM

## 2017-06-09 NOTE — Lactation Note (Addendum)
This note was copied from a baby's chart. Lactation Consultation Note New mom has large pendulum breast w/short shaft nipples. Has some edema to areola. Areola compressible some but is to thick to obtain a good latch. Fitted mom w/#20 NS. #24 to large at this time. Shells given to wear in bra in am, hand pump to pre-pump before latching and stimulation. Hand expression glisten to Rt. Breast, expressed 1 drop of colostrum from Lt. Breast.  Mom encouraged to feed baby 8-12 times/24 hours and with feeding cues. Wake every 3 hrs if hasn't cued to feed. Educated on Newborn behavior, STS, cluster feeding, supply and demand.  Notified nursery RN that [facility] unable to collect colostrum to give to baby to elevate low blood sugar. Mom has baby STS. WH/LC brochure given w/resources, support groups and LC services.  Patient Name: Megan Ibarra ZOXWR'U Date: 06/09/2017 Reason for consult: Initial assessment;Other (Comment) (hypoglycemia)   Maternal Data Has patient been taught Hand Expression?: Yes Does the patient have breastfeeding experience prior to this delivery?: No  Feeding Feeding Type: Formula Length of feed: 0 min (attempt;)  LATCH Score Latch: Too sleepy or reluctant, no latch achieved, no sucking elicited.  Audible Swallowing: None  Type of Nipple: Everted at rest and after stimulation (short shaft)  Comfort (Breast/Nipple): Soft / non-tender  Hold (Positioning): Assistance needed to correctly position infant at breast and maintain latch.  LATCH Score: 5  Interventions Interventions: Breast feeding basics reviewed;Support pillows;Assisted with latch;Position options;Skin to skin;Breast massage;Hand express;Breast compression;Shells;Hand pump  Lactation Tools Discussed/Used Tools: Shells;Pump;Nipple Dorris Carnes Breast pump type: Manual WIC Program: Yes Pump Review: Setup, frequency, and cleaning;Milk Storage Initiated by:: Peri Jefferson RN IBCLC Date initiated::  06/09/17   Consult Status Consult Status: Follow-up Date: 06/09/17 Follow-up type: In-patient    Alayne Estrella, Diamond Nickel 06/09/2017, 1:31 AM

## 2017-06-09 NOTE — Progress Notes (Signed)
Called to room to assess PIV.  Pt states her hand is swelling and is painful.  PIV infiltrated, d/c'd PIV.  Per pt she doesn't want another PIV placed due to being "a hard stick".  Notified pt's RN.

## 2017-06-09 NOTE — Progress Notes (Signed)
Called [facility] CNM regarding pt's BPs since arriving to [facility]. Last 2 BPs have been 160s/70s. Order placed by CNM.

## 2017-06-09 NOTE — Lactation Note (Signed)
This note was copied from a baby's chart. Lactation Consultation Note  Patient Name: Megan Ibarra ZOXWR'U Date: 06/09/2017 Reason for consult: Follow-up assessment  With this mom of a term baby, now 22 hours old. Mom has been formula feeding by spoon and syringe, and trying to get baby to latch, to no avail. On exam of baby's mouth, he has a very tight upper lip frenulum, and a tight anterior lingual frenulum, a high palate, and humping of tongue in back of his mouth. He is not able to stick his tongue out  - it stays behind his bottom gum line, causing biting.  I was able to apply 20 nippler shield, with about 0.5 ml's of colostrum in it, and latch the baby in football hold. He did not suckle at all. I then did oral stimulation with a gloved finger, coated in the colostrum, It took a few minutes, but he eventually was able to take a hold of my finger, but was biting, and no forward motion.  I set up a DEP, instructed mom in it;s use, and advised her to pump 8 times a day, in initiation setting, followed by HE. Mom knows to feed whatever she expresses to the baby, prior to formula.  I also had the parents begin bottle feeding, to help the baby learn how to coordinate sucking and swallowing. He did well, and took 10 ml'sI spoke to .Barnetta Chapel, faculty NNP  about the baby's tongue tie, and she will leave a message for Dr. Ezequiel Essex to speak to the parents tomorrow, about possible clipping the frenulum.  Mom knows to call for questions/concerns.   Maternal Data    Feeding Feeding Type: Bottle Fed - Formula Nipple Type: Slow - flow Length of feed: 0 min  LATCH Score Latch: Repeated attempts needed to sustain latch, nipple held in mouth throughout feeding, stimulation needed to elicit sucking reflex.  Audible Swallowing: None  Type of Nipple: Flat (20 nipple shield)  Comfort (Breast/Nipple): Soft / non-tender  Hold (Positioning): Assistance needed to correctly position infant at  breast and maintain latch.  LATCH Score: 5  Interventions Interventions: Breast feeding basics reviewed;Assisted with latch;Skin to skin;Hand express;Adjust position;Support pillows;Position options;Expressed milk;DEBP;Hand pump  Lactation Tools Discussed/Used Tools: Pump;Flanges Nipple shield size: 20 Flange Size: 21 (24 slightly big, mom to try 21, but will go back to 24 if 21 too tight) Breast pump type: Double-Electric Breast Pump WIC Program:  (mom to call them tomorrow, let them know the baby ws born)   Consult Status Consult Status: Follow-up Date: 06/10/17 Follow-up type: In-patient    Alfred Levins 06/09/2017, 3:59 PM

## 2017-06-09 NOTE — Anesthesia Postprocedure Evaluation (Signed)
Anesthesia Post Note  Patient: Megan Ibarra  Procedure(s) Performed: * No procedures listed *     Patient location during evaluation: Mother Baby Anesthesia Type: Epidural Level of consciousness: awake and alert and oriented Pain management: pain level controlled Vital Signs Assessment: post-procedure vital signs reviewed and stable Respiratory status: spontaneous breathing and nonlabored ventilation Cardiovascular status: stable Postop Assessment: no headache, patient able to bend at knees, no backache, no apparent nausea or vomiting, epidural receding and adequate PO intake Anesthetic complications: no    Last Vitals:  Vitals:   06/09/17 0229 06/09/17 0410  BP: (!) 142/65 (!) 149/76  Pulse: (!) 104 (!) 102  Resp:  20  Temp:  36.7 C  SpO2:      Last Pain:  Vitals:   06/09/17 0410  TempSrc: Oral  PainSc: 0-No pain   Pain Goal:                 [place]

## 2017-06-09 NOTE — Progress Notes (Signed)
MOB was referred for history of anxiety.  Referral is screened out by Clinical Social Worker because none of the following criteria appear to apply and there are no reports impacting the pregnancy or her transition to the postpartum period. CSW does not deem it clinically necessary to further investigate at this time.  -History of anxiety during this pregnancy, or of post-partum depression.  - Diagnosis of anxiety within last 3 years.-  - History of anxiety due to pregnancy loss/loss of child or -MOB's symptoms are currently being treated with medication and/or therapy.  CSW met with MOB at bedside to assess hx of anxiety. At this time, MOB informed this writer that this is an old dx and was brought on by an old job she had that she no longer has. MOB notes her anxiety is managed and under control and she feels she needs no clinical intervention. Please contact the Clinical Social Worker if needs arise or upon MOB request.   Djibril Glogowski, MSW, LCSW-A Clinical Social Worker  [facility]  Office: [phone]  

## 2017-06-10 ENCOUNTER — Inpatient Hospital Stay (HOSPITAL_COMMUNITY): Admission: RE | Admit: 2017-06-10 | Payer: Medicaid Other | Source: Ambulatory Visit

## 2017-06-10 LAB — GLUCOSE, CAPILLARY
GLUCOSE-CAPILLARY: 136 mg/dL — AB (ref 65–99)
Glucose-Capillary: 84 mg/dL (ref 65–99)

## 2017-06-10 MED ORDER — METFORMIN HCL 500 MG PO TABS: 500.0000 mg | ORAL_TABLET | Freq: Two times a day (BID) | ORAL | 2 refills | Status: DC

## 2017-06-10 MED ORDER — NIFEDIPINE ER OSMOTIC RELEASE 90 MG PO TB24: 90.0000 mg | ORAL_TABLET | Freq: Every day | ORAL | 2 refills | Status: DC

## 2017-06-10 MED ORDER — IBUPROFEN 600 MG PO TABS: 600.0000 mg | ORAL_TABLET | Freq: Four times a day (QID) | ORAL | 1 refills | Status: DC | PRN

## 2017-06-10 MED ORDER — LABETALOL HCL 200 MG PO TABS: 200.0000 mg | ORAL_TABLET | Freq: Three times a day (TID) | ORAL | 2 refills | Status: DC

## 2017-06-10 NOTE — H&P (Signed)
Megan Ibarra is a 26 y.o. female G1@ 38 6/7 weeks with Type 2 DM, CHTN sent over from the office in early labor.  Pt with complaint of contractions every 5 minutes which was confirmed by in office NST.  +fetal movement.  Cervix was 1 cm 4 days ago, now 3-4/90/-3.  NST has accelerations, 10x10, but pt needed acoustic stimulation x 2.  Due to high risk pregnancy, term, recommended pt go to L&D. Pregnancy has been managed by Dr. Gerald Leitz.  DM controlled with Lantus and Novolog.  BP controlled with Procardia XL 60 mg.  OB History    Gravida Para Term Preterm AB Living   SAB TAB Ectopic Multiple Live Births         0 1     Past Medical History:  Diagnosis Date  . Diabetes mellitus without complication (HCC)   . Hypertension    Past Surgical History:  Procedure Laterality Date  . NO PAST SURGERIES     Family History: family history includes Diabetes in her maternal grandmother and mother; Hypertension in her maternal grandmother and mother. Social History:  reports that she quit smoking about 11 months ago. Her smoking use included Cigarettes. She smoked 0.25 packs per day. She has never used smokeless tobacco. She reports that she does not drink alcohol or use drugs.     Maternal Diabetes: Yes:  Diabetes Type:  Pre-pregnancy Genetic Screening: Normal Maternal Ultrasounds/Referrals: Normal Fetal Ultrasounds or other Referrals:  None Maternal Substance Abuse:  No Significant Maternal Medications:  Meds include: Other: Insulin, procardia Significant Maternal Lab Results:  Lab values include: Group B Strep negative Other Comments:  CHTN, Type 2 DM, morbid obesity  Review of Systems  Constitutional: Negative for chills and fever.  Gastrointestinal: Positive for abdominal pain.   Maternal Medical History:  Reason for admission: Contractions.   Contractions: Onset was 6-12 hours ago.   Frequency: regular.    Fetal activity: Perceived fetal activity is normal.     Prenatal complications: PIH.   Prenatal Complications - Diabetes: type 2. Diabetes is managed by insulin injections.      Dilation: 10 Effacement (%): 100 Station: +1, +2 Exam by:: J. Emly, cnm Blood pressure 136/70, pulse 95, temperature 98.2 F (36.8 C), temperature source Oral, resp. rate 18, height  (1.651 m), weight 122 kg (269 lb), last menstrual period 08/31/2016, SpO2 100 %, unknown if currently breastfeeding. Maternal Exam:  Abdomen: Patient reports no abdominal tenderness. Estimated fetal weight is Limited due to habitus, 7-8 lbs.   Fetal presentation: vertex  Introitus: Normal vulva. Amniotic fluid character: not assessed.  Pelvis: adequate for delivery.   Cervix: Cervix evaluated by digital exam.     Fetal Exam Fetal Monitor Review: Mode: fetoscope.   Baseline rate: 120s.  Variability: moderate (6-25 bpm).   Pattern: accelerations present and no decelerations.    Fetal State Assessment: Category I - tracings are normal.     Physical Exam  Constitutional: She is oriented to person, place, and time. She appears well-developed and well-nourished. No distress.  HENT:  Head: Normocephalic and atraumatic.  Eyes: EOM are normal.  Neck: Normal range of motion.  Cardiovascular: Normal rate and regular rhythm.   Respiratory: Effort normal and breath sounds normal. No respiratory distress.  GI: She exhibits no distension.  Musculoskeletal: Normal range of motion. She exhibits edema. She exhibits no tenderness.  Neurological: She is alert and oriented to person,  place, and time. She has normal reflexes.  Skin: Skin is warm and dry.  Psychiatric: She has a normal mood and affect.    Prenatal labs: ABO, Rh: --/--/A POS (09/21 1940) Antibody: NEG (09/21 1854) Rubella: Immune (02/26 0000) RPR: Non Reactive (09/21 1930)  HBsAg: Negative (02/26 0000)  HIV: Non-reactive (02/26 0000)  GBS: Negative (09/04 0000)   Assessment/Plan: IUP @ 38 6/7  weeks Labor CHTN Type 2 DM Reassuring fetal status. Admit to L&D for expectant management. [facility] given check out prior to pt's arrival to hospital.   Dion Body, [facility] 06/10/2017, 7:55 AM

## 2017-06-10 NOTE — Discharge Summary (Addendum)
OB Discharge Summary     Patient Name: Megan Ibarra DOB: [birth year] MRN: [id number]  Date of admission: 06/07/2017 Delivering MD: Gerrit Heck   Date of discharge: 06/10/2017  Admitting diagnosis: LABOR Intrauterine pregnancy: [redacted]w[redacted]d     Secondary diagnosis:  Principal Problem:   SVD (spontaneous vaginal delivery) Active Problems:   Uncontrolled diabetes mellitus (HCC)   Normal labor   Laceration of vaginal wall or sulcus without perineal laceration during delivery   Chronic hypertension in pregnancy  Additional problems:NOne     Discharge diagnosis: Term Pregnancy Delivered, CHTN and Type 2 DM                                                                                                Post partum procedures:None  Augmentation: see records  Complications: None  Hospital course:  Onset of Labor With Vaginal Delivery     26 y.o. yo G1P1001 at [redacted]w[redacted]d was admitted in Active Labor on 06/07/2017. Patient had an uncomplicated labor course as follows:  Membrane Rupture Time/Date: 1:52 AM ,06/08/2017   Intrapartum Procedures: Episiotomy: None [1]                                         Lacerations:  Sulcus [9]  Patient had a delivery of a Viable infant. 06/08/2017  Information for the patient's newborn:  Ahliya, Glatt [[id number]]  Delivery Method: Vaginal, Spontaneous Delivery (Filed from Delivery Summary)    Pateint had an uncomplicated postpartum course.  She is ambulating, tolerating a regular diet, passing flatus, and urinating well. Patient is discharged home in stable condition on 06/10/17.   Physical exam  Vitals:   06/09/17 1754 06/09/17 1828 06/09/17 2300 06/10/17 0633  BP: (!) 160/87 (!) 153/77 (!) 143/66 136/70  Pulse: 96 93 95 95  Resp: Temp: 98.2 F (36.8 C)  97.9 F (36.6 C) 98.2 F (36.8 C)  TempSrc: Oral  Oral Oral  SpO2:      Weight:      Height:       General: alert, cooperative and no distress Lochia:  appropriate Uterine Fundus: firm Incision: N/A DVT Evaluation: No evidence of DVT seen on physical exam. Labs: Lab Results  Component Value Date   WBC 17.0 (H) 06/09/2017   HGB 10.5 (L) 06/09/2017   HCT 30.9 (L) 06/09/2017   MCV 84.7 06/09/2017   PLT 319 06/09/2017   CMP Latest Ref Rng & Units 06/07/2017  Glucose 65 - 99 mg/dL 846(N)  BUN 6 - 20 mg/dL 7  Creatinine 6.29 - 5.28 mg/dL 4.13  Sodium 244 - 010 mmol/L 133(L)  Potassium 3.5 - 5.1 mmol/L 4.4  Chloride 101 - 111 mmol/L 103  CO2 22 - 32 mmol/L 19(L)  Calcium 8.9 - 10.3 mg/dL 9.5  Total Protein 6.5 - 8.1 g/dL 6.4(L)  Total Bilirubin 0.3 - 1.2 mg/dL 0.4  Alkaline Phos 38 - 126 U/L 118  AST 15 - 41 U/L 29  ALT 14 -  54 U/L 23    Discharge instruction: per After Visit Summary and "Baby and Me Booklet".  After visit meds:  Allergies as of 06/10/2017      Reactions   Latex Swelling, Rash, Hives      Medication List    STOP taking these medications   insulin aspart 100 UNIT/ML injection Commonly known as:  novoLOG   insulin glargine 100 UNIT/ML injection Commonly known as:  LANTUS     TAKE these medications   ibuprofen 600 MG tablet Commonly known as:  ADVIL,MOTRIN Take 1 tablet (600 mg total) by mouth every 6 (six) hours as needed for mild pain or moderate pain.   metFORMIN 500 MG tablet Commonly known as:  GLUCOPHAGE Take 1 tablet (500 mg total) by mouth 2 (two) times daily with a meal.   NIFEdipine 90 MG 24 hr tablet Commonly known as:  PROCARDIA XL/ADALAT-CC Take 1 tablet (90 mg total) by mouth daily. What changed:  medication strength  how much to take  Another medication with the same name was removed. Continue taking this medication, and follow the directions you see here.   Prenatal Vitamins 0.8 MG tablet Take 1 tablet by mouth daily.            Discharge Care Instructions        Start     Ordered   06/10/17 0000  NIFEdipine (PROCARDIA XL/ADALAT-CC) 90 MG 24 hr tablet  Daily      06/10/17 0953   06/10/17 0000  metFORMIN (GLUCOPHAGE) 500 MG tablet  2 times daily with meals     06/10/17 0953   06/10/17 0000  ibuprofen (ADVIL,MOTRIN) 600 MG tablet  Every 6 hours PRN     06/10/17 0953   06/10/17 0000  Diet - low sodium heart healthy     06/10/17 0953   06/10/17 0000  Call MD for:  temperature >100.4     06/10/17 0953   06/10/17 0000  Call MD for:  persistant nausea and vomiting     06/10/17 0953   06/10/17 0000  Call MD for:  severe uncontrolled pain     06/10/17 0953   06/10/17 0000  Call MD for:  difficulty breathing, headache or visual disturbances     06/10/17 0953   06/10/17 0000  Activity as tolerated     06/10/17 0953   06/10/17 0000  Sexual acrtivity    Comments:  Avoid sex for 6 weeks   06/10/17 0953   06/10/17 0000  Diet Carb Modified     06/10/17 0953      Diet: carb modified diet  Activity: Advance as tolerated. Pelvic rest for 6 weeks.   Outpatient follow up:1 week for blood pressure check  Follow up Appt:No future appointments. Follow up Visit:No Follow-up on file.  Postpartum contraception: Not Discussed  Newborn Data: Live born female  Birth Weight: 6 lb 13.5 oz (3105 g) APGAR: 8, 9  Baby Feeding: Breast Disposition:home with mother .Marland Kitchen Pt should also take labetalol 200 mg three times a day for chronic hypertension. In addistion to the procardia XL 90 mg once a day.    06/10/2017 Jessee Avers., MD

## 2017-06-10 NOTE — Lactation Note (Signed)
This note was copied from a baby's chart. Lactation Consultation Note  Patient Name: Megan Ibarra Date: 06/10/2017 Reason for consult: Follow-up assessment;Other (Comment) [facility] loaner , $30.00 recieved / parents are aware of date and location to return )  This Dyad was seen at 12:15 pm, LC assessed breast tissue and noted both areolas to be semi - to non compressible.  And neither nipple / areola complexes were candidates for Nipple Shields ( #20 NS or #24 NS )  LC recommended to continue supplementing baby with formula until milk comes in and then switch over to EBM for feedings , and by the end of today increase to 30 ml , gradually increasing volume 45-60 ml by 7 days per feeding .  LC discussed Supply and demand - and the importance of consistent pumping at least 8 x's a day , shells ,  Sore nipple and engorgement prevention and tx reviewed.  LC discussed with mom and add edema takes time to resolve and the 1st 2 weeks of breast feeding is the most important  And consistent pumping is needed due to the baby not being the pump presently due to edema.  Mom and dad receptive to teaching.  LC explained the DEBP [facility] for [facility] Loaner and will fax a form for request for DEBP. Mom aware to call and request a  DEBP .  Mom receptive to coming back for [facility] O/P appt in 4-7 days. Request for [facility] O/P appt. Has been made via Epic.  Mother informed of post-discharge support and given phone number to the lactation department, including services for phone call assistance; out-patient appointments; and breastfeeding support group. List of other breastfeeding resources in the community given in the handout. Encouraged mother to call for problems or concerns related to breastfeeding.   Maternal Data Has patient been taught Hand Expression?: Yes (tiny amount , areolas tuff )  Feeding Feeding Type: Bottle Fed - Formula Nipple Type: Slow - flow Length of feed:  (no latch )  LATCH  Score Latch: Too sleepy or reluctant, no latch achieved, no sucking elicited.  Audible Swallowing: None  Type of Nipple: Flat (tough nipple )  Comfort (Breast/Nipple): Soft / non-tender  Hold (Positioning): Assistance needed to correctly position infant at breast and maintain latch.  LATCH Score: 4  Interventions Interventions: Breast feeding basics reviewed  Lactation Tools Discussed/Used Tools: Shells;Pump Nipple shield size: 20;24;Other (comment) (neither NS stayed on , tissue is not a candidate for a NS ) Shell Type: Inverted Breast pump type: Double-Electric Breast Pump WIC Program: Yes (LC faxed a DEBP [facility] loaner request )   Consult Status Consult Status: Follow-up Date: 06/10/17 Follow-up type: Out-patient [facility] Placed a request in the [facility] clinic basket )    Kathrin Greathouse 06/10/2017, 3:31 PM

## 2017-11-09 ENCOUNTER — Ambulatory Visit (HOSPITAL_COMMUNITY)
Admission: EM | Admit: 2017-11-09 | Discharge: 2017-11-09 | Disposition: A | Discharge status: Home / Self Care | Payer: Medicaid Other | Attending: Internal Medicine | Admitting: Internal Medicine

## 2017-11-09 ENCOUNTER — Encounter (HOSPITAL_COMMUNITY): Payer: Self-pay | Admitting: Emergency Medicine

## 2017-11-09 ENCOUNTER — Other Ambulatory Visit: Payer: Self-pay

## 2017-11-09 DIAGNOSIS — M791 Myalgia, unspecified site: Secondary | ICD-10-CM | POA: Diagnosis not present

## 2017-11-09 DIAGNOSIS — J029 Acute pharyngitis, unspecified: Secondary | ICD-10-CM

## 2017-11-09 DIAGNOSIS — R42 Dizziness and giddiness: Secondary | ICD-10-CM

## 2017-11-09 DIAGNOSIS — R5383 Other fatigue: Secondary | ICD-10-CM | POA: Diagnosis present

## 2017-11-09 DIAGNOSIS — R05 Cough: Secondary | ICD-10-CM | POA: Diagnosis present

## 2017-11-09 DIAGNOSIS — E1165 Type 2 diabetes mellitus with hyperglycemia: Secondary | ICD-10-CM | POA: Insufficient documentation

## 2017-11-09 DIAGNOSIS — R69 Illness, unspecified: Secondary | ICD-10-CM | POA: Diagnosis not present

## 2017-11-09 DIAGNOSIS — I10 Essential (primary) hypertension: Secondary | ICD-10-CM | POA: Diagnosis not present

## 2017-11-09 DIAGNOSIS — R509 Fever, unspecified: Secondary | ICD-10-CM | POA: Insufficient documentation

## 2017-11-09 DIAGNOSIS — Z7984 Long term (current) use of oral hypoglycemic drugs: Secondary | ICD-10-CM | POA: Diagnosis not present

## 2017-11-09 DIAGNOSIS — J111 Influenza due to unidentified influenza virus with other respiratory manifestations: Secondary | ICD-10-CM | POA: Diagnosis not present

## 2017-11-09 DIAGNOSIS — Z833 Family history of diabetes mellitus: Secondary | ICD-10-CM | POA: Insufficient documentation

## 2017-11-09 DIAGNOSIS — R51 Headache: Secondary | ICD-10-CM | POA: Insufficient documentation

## 2017-11-09 DIAGNOSIS — R110 Nausea: Secondary | ICD-10-CM

## 2017-11-09 DIAGNOSIS — Z87891 Personal history of nicotine dependence: Secondary | ICD-10-CM | POA: Diagnosis not present

## 2017-11-09 DIAGNOSIS — R6883 Chills (without fever): Secondary | ICD-10-CM | POA: Diagnosis present

## 2017-11-09 LAB — POCT RAPID STREP A: Streptococcus, Group A Screen (Direct): NEGATIVE

## 2017-11-09 MED ORDER — OSELTAMIVIR PHOSPHATE 75 MG PO CAPS: 75.0000 mg | ORAL_CAPSULE | Freq: Two times a day (BID) | ORAL | 0 refills | Status: AC

## 2017-11-09 MED ORDER — ACETAMINOPHEN 325 MG PO TABS
ORAL_TABLET | ORAL | Status: AC
  Filled 2017-11-09: qty 2

## 2017-11-09 MED ORDER — ACETAMINOPHEN 325 MG PO TABS
650.0000 mg | ORAL_TABLET | Freq: Once | ORAL | Status: AC
  Administered 2017-11-09: 650 mg via ORAL

## 2017-11-09 MED ORDER — ACETAMINOPHEN 325 MG PO TABS: 650.0000 mg | ORAL_TABLET | Freq: Four times a day (QID) | ORAL | 0 refills | Status: DC | PRN

## 2017-11-09 MED ORDER — IBUPROFEN 600 MG PO TABS: 600.0000 mg | ORAL_TABLET | Freq: Four times a day (QID) | ORAL | 0 refills | Status: DC | PRN

## 2017-11-09 NOTE — Discharge Instructions (Signed)
Please use Tylenol and ibuprofen alternating every 4 hours to help with fever, headache, body aches.  For congestion please use Flonase or a saline nasal spray for congestion.  Please do not take Benadryl or Zyrtec.   For cough please continue cough drops. Please use honey/honey tea for help with cough. May also try a cold mist humidifier.   Honey Tea:  For sore throat try using a honey-based tea. Use 3 teaspoons of honey with juice squeezed from half lemon. Place shaved pieces of ginger into 1/2-1 cup of water and warm over stove top. Then mix the ingredients and repeat every 4 hours as needed.

## 2017-11-09 NOTE — ED Triage Notes (Signed)
Chills, dizziness, fatigue, fever, headaches

## 2017-11-09 NOTE — ED Provider Notes (Signed)
[facility]    CSN: [id number] Arrival date & time: 11/09/17  1210     History   Chief Complaint Chief Complaint  Patient presents with  . Cough  . Fatigue  . Chills  . Generalized Body Aches    HPI Megan Ibarra is a 27 y.o. female history of hypertension, DM, Patient is presenting with URI symptoms- congestion, cough, sore throat.  Patient also endorsing fever, chills, fatigue, headache, body aches.  She is also having a dizziness sensation when she sits up.   Symptoms have been going on for approximately 1 day. Patient has tried ibuprofen and cough drops, with minimal relief.  Patient has had lack of appetite and nausea.  Denies vomiting, diarrhea. Denies shortness of breath and chest pain.  Patient is breast-feeding.   HPI  Past Medical History:  Diagnosis Date  . Diabetes mellitus without complication (HCC)   . Hypertension     Patient Active Problem List   Diagnosis Date Noted  . SVD (spontaneous vaginal delivery) 06/08/2017  . Laceration of vaginal wall or sulcus without perineal laceration during delivery 06/08/2017  . Chronic hypertension in pregnancy 06/08/2017  . Normal labor 06/07/2017  . Uncontrolled diabetes mellitus (HCC) 11/30/2016    Past Surgical History:  Procedure Laterality Date  . NO PAST SURGERIES      OB History    Gravida Para Term Preterm AB Living   1 1 1     1    SAB TAB Ectopic Multiple Live Births         0 1       Home Medications    Prior to Admission medications   Medication Sig Start Date End Date Taking? Authorizing Provider  labetalol (NORMODYNE) 200 MG tablet Take 1 tablet (200 mg total) by mouth 3 (three) times daily. 06/10/17  Yes Gerald Leitzole, Tara, MD  metFORMIN (GLUCOPHAGE) 500 MG tablet Take 1 tablet (500 mg total) by mouth 2 (two) times daily with a meal. 06/10/17  Yes Gerald Leitzole, Tara, MD  NIFEdipine (PROCARDIA XL/ADALAT-CC) 90 MG 24 hr tablet Take 1 tablet (90 mg total) by mouth daily. 06/10/17  Yes Gerald Leitzole, Tara,  MD  acetaminophen (TYLENOL) 325 MG tablet Take 2 tablets (650 mg total) by mouth every 6 (six) hours as needed. 11/09/17   Wieters, Hallie C, PA-C  ibuprofen (ADVIL,MOTRIN) 600 MG tablet Take 1 tablet (600 mg total) by mouth every 6 (six) hours as needed. 11/09/17   Wieters, Hallie C, PA-C  oseltamivir (TAMIFLU) 75 MG capsule Take 1 capsule (75 mg total) by mouth 2 (two) times daily for 5 days. 11/09/17 11/14/17  Wieters, Hallie C, PA-C  Prenatal Multivit-Min-Fe-FA (PRENATAL VITAMINS) 0.8 MG tablet Take 1 tablet by mouth daily. 10/07/16   Constant, Peggy, MD    Family History Family History  Problem Relation Age of Onset  . Hypertension Mother   . Diabetes Mother   . Hypertension Maternal Grandmother   . Diabetes Maternal Grandmother     Social History Social History   Tobacco Use  . Smoking status: Former Smoker    Packs/day: 0.25    Types: Cigarettes    Last attempt to quit: 07/02/2016    Years since quitting: 1.3  . Smokeless tobacco: Never Used  Substance Use Topics  . Alcohol use: No  . Drug use: No     Allergies   Latex   Review of Systems Review of Systems  Constitutional: Positive for appetite change, chills, fatigue and fever.  HENT: Positive  for congestion, rhinorrhea and sore throat. Negative for ear pain, sinus pressure and trouble swallowing.   Respiratory: Positive for cough. Negative for chest tightness and shortness of breath.   Cardiovascular: Negative for chest pain.  Gastrointestinal: Positive for nausea. Negative for abdominal pain and vomiting.  Musculoskeletal: Positive for myalgias.  Skin: Negative for rash.  Neurological: Positive for dizziness and headaches. Negative for light-headedness.     Physical Exam Triage Vital Signs ED Triage Vitals  Enc Vitals Group     BP 11/09/17 1321 (!) 159/91     Pulse Rate 11/09/17 1321 (!) 130     Resp --      Temp 11/09/17 1321 (!) 102.1 F (38.9 C)     Temp Source 11/09/17 1321 Oral     SpO2 11/09/17  1321 95 %     Weight --      Height --      Head Circumference --      Peak Flow --      Pain Score 11/09/17 1320 10     Pain Loc --      Pain Edu? --      Excl. in GC? --    No data found.  Updated Vital Signs BP (!) 159/91 (BP Location: Left Arm)   Pulse (!) 130   Temp (!) 102.1 F (38.9 C) (Oral)   SpO2 95%   Breastfeeding? Yes   Visual Acuity Right Eye Distance:   Left Eye Distance:   Bilateral Distance:    Right Eye Near:   Left Eye Near:    Bilateral Near:     Physical Exam  Constitutional: She appears well-developed and well-nourished. No distress.  Lying on exam table under a blanket  HENT:  Head: Normocephalic and atraumatic.  Bilateral TMs without erythema, nasal mucosa erythematous with rhinorrhea present, posterior oropharynx erythematous, no tonsillar enlargement or exudate.  Eyes: Conjunctivae are normal.  Neck: Neck supple.  No cervical lymphadenopathy  Cardiovascular: Normal rate and regular rhythm.  No murmur heard. Pulmonary/Chest: Effort normal and breath sounds normal. No respiratory distress.  Breathing comfortably at rest, clear to auscultation bilaterally  Abdominal: Soft. There is no tenderness.  Musculoskeletal: She exhibits no edema.  Neurological: She is alert.  Skin: Skin is warm and dry.  Psychiatric: She has a normal mood and affect.  Nursing note and vitals reviewed.    UC Treatments / Results  Labs (all labs ordered are listed, but only abnormal results are displayed) Labs Reviewed  CULTURE, GROUP A STREP [facility])  POCT RAPID STREP A    EKG  EKG Interpretation None       Radiology No results found.  Procedures Procedures (including critical care time)  Medications Ordered in UC Medications  acetaminophen (TYLENOL) tablet 650 mg (650 mg Oral Given 11/09/17 1332)     Initial Impression / Assessment and Plan / UC Course  I have reviewed the triage vital signs and the nursing notes.  Pertinent labs & imaging  results that were available during my care of the patient were reviewed by me and considered in my medical decision making (see chart for details).     Strep test negative, patient with symptoms and vital signs concerning for flu.  Will treat symptomatically.  Patient is breast-feeding, recommend largely nonpharmacologic options.  Advised saline nasal spray, honey for cough, [employer]  Tylenol ibuprofen for fever/headache/body aches.  Will provide Tamiflu as it appeared the RID was <1%.  We will go ahead and provide  this patient in Tamiflu window, has 22-month-old. Discussed strict return precautions. Patient verbalized understanding and is agreeable with plan.   Final Clinical Impressions(s) / UC Diagnoses   Final diagnoses:  Influenza-like illness    ED Discharge Orders        Ordered    oseltamivir (TAMIFLU) 75 MG capsule  2 times daily     11/09/17 1343    acetaminophen (TYLENOL) 325 MG tablet  Every 6 hours PRN     11/09/17 1350    ibuprofen (ADVIL,MOTRIN) 600 MG tablet  Every 6 hours PRN     11/09/17 1350       Controlled Substance Prescriptions  Controlled Substance Registry consulted? Not Applicable   Lew Dawes, [place] 11/09/17 1504

## 2017-11-12 LAB — CULTURE, GROUP A STREP (THRC)

## 2017-11-20 ENCOUNTER — Other Ambulatory Visit: Payer: Self-pay | Admitting: Obstetrics and Gynecology

## 2017-11-20 ENCOUNTER — Other Ambulatory Visit (HOSPITAL_COMMUNITY)
Admission: RE | Admit: 2017-11-20 | Discharge: 2017-11-20 | Disposition: A | Discharge status: Home / Self Care | Payer: Medicaid Other | Source: Ambulatory Visit | Attending: Obstetrics and Gynecology | Admitting: Obstetrics and Gynecology

## 2017-11-20 DIAGNOSIS — Z124 Encounter for screening for malignant neoplasm of cervix: Secondary | ICD-10-CM | POA: Insufficient documentation

## 2017-11-22 LAB — CYTOLOGY - PAP: DIAGNOSIS: NEGATIVE

## 2018-09-05 IMAGING — US US MFM OB DETAIL+14 WK
2 series · 13 of 28 positions shown · non-contrast
Comparison: none

[Series 1: us mfm ob detail+14 wk · 3 of 17 slices shown (1 of 2)]
[im 4/17]
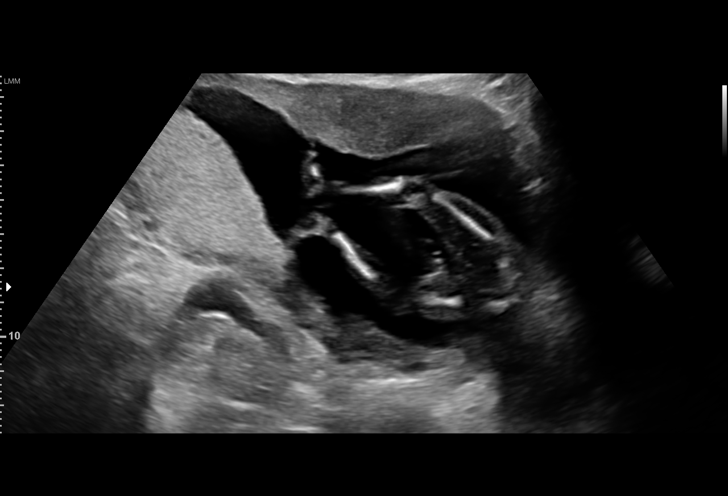
[im 10/17]
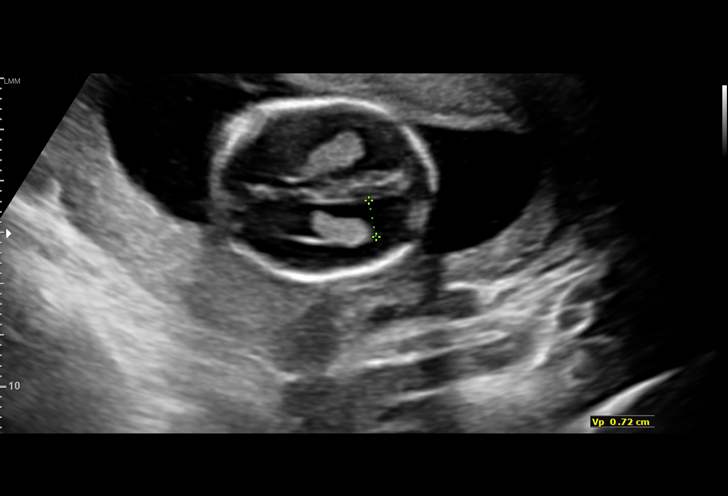
[im 17/17]
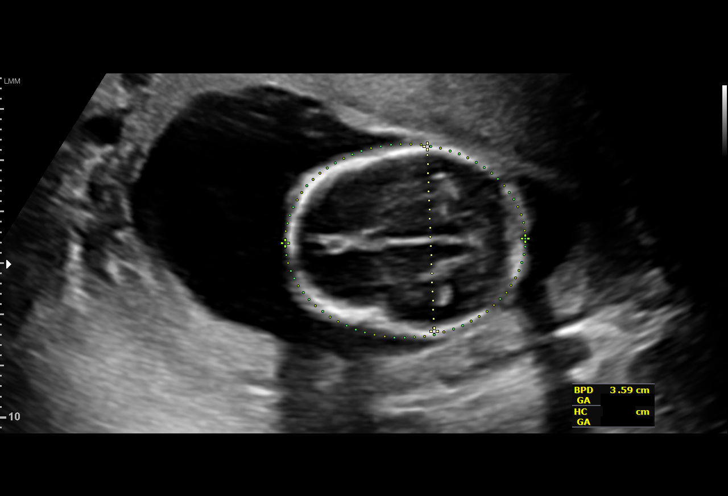

[Series 2: us mfm ob detail+14 wk · 58 acquisitions, 10 frames shown (2 of 2)]
[im 3/58]
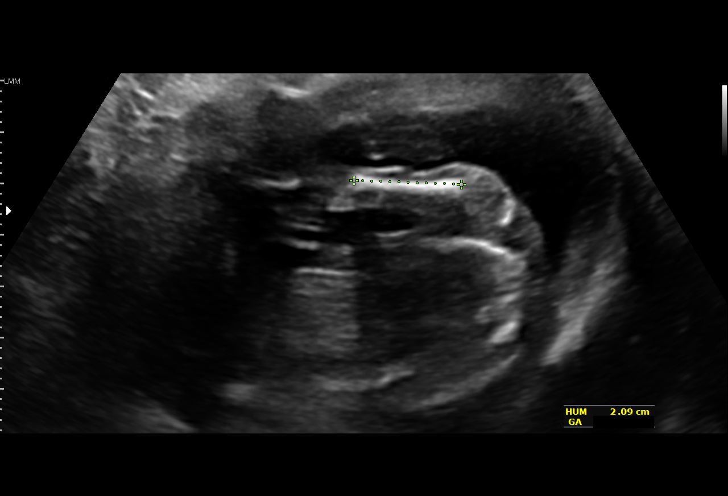
[im 9/58]
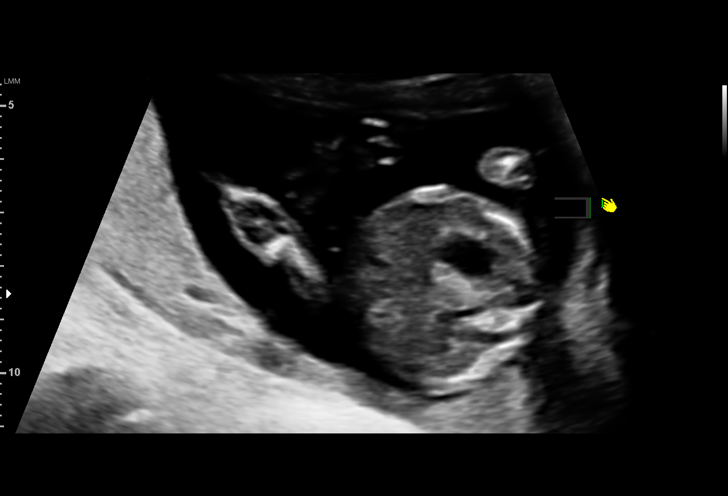
[im 14/58]
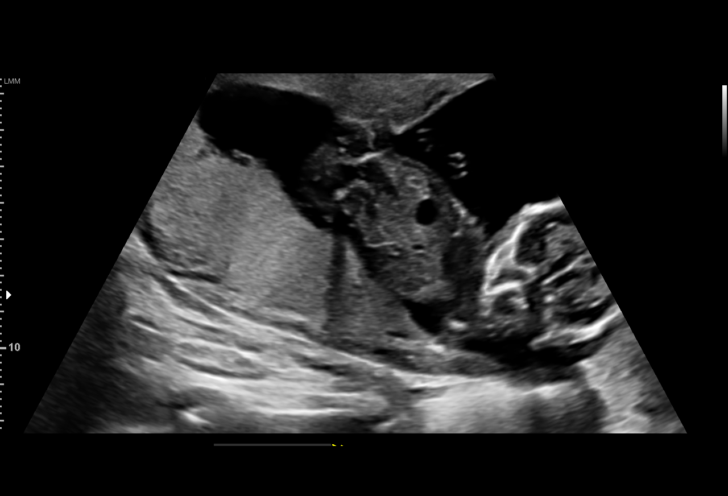
[im 22/58]
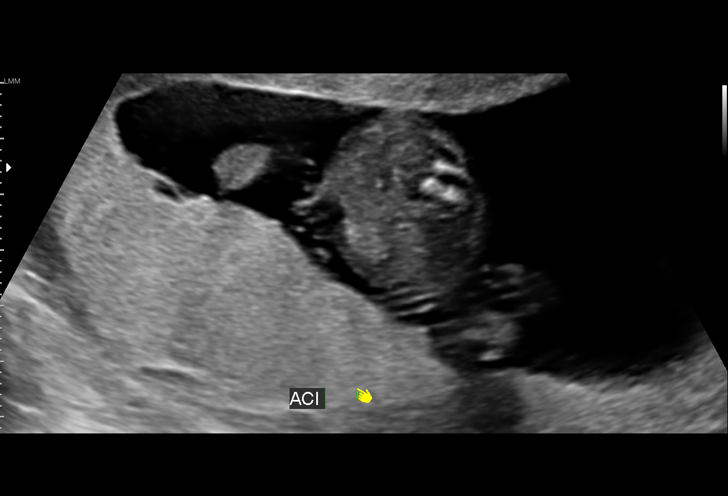
[im 28/58]
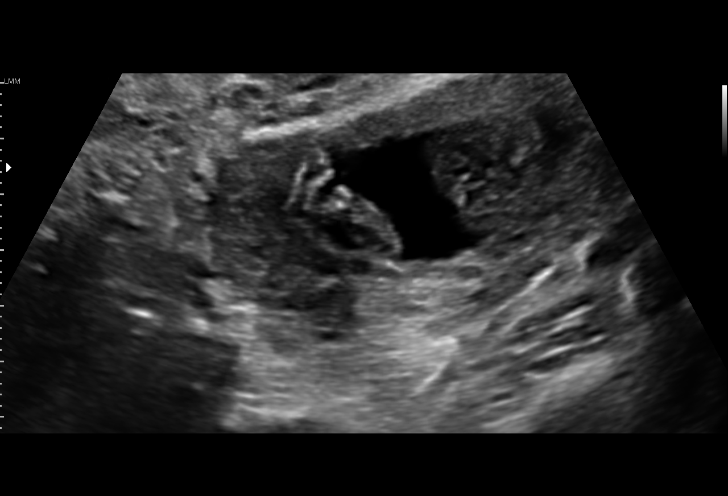
[im 33/58]
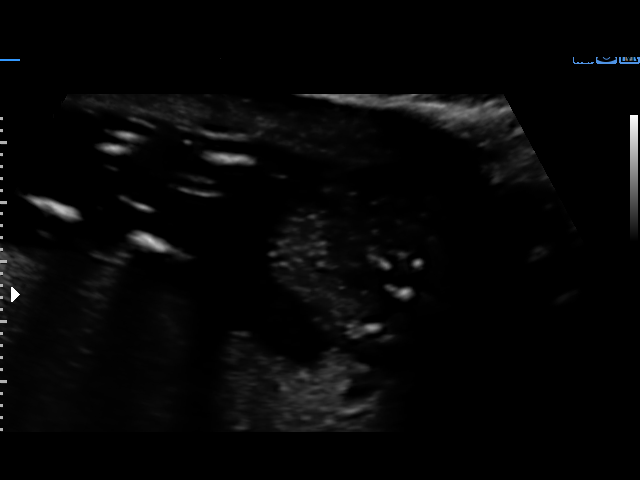
[im 39/58]
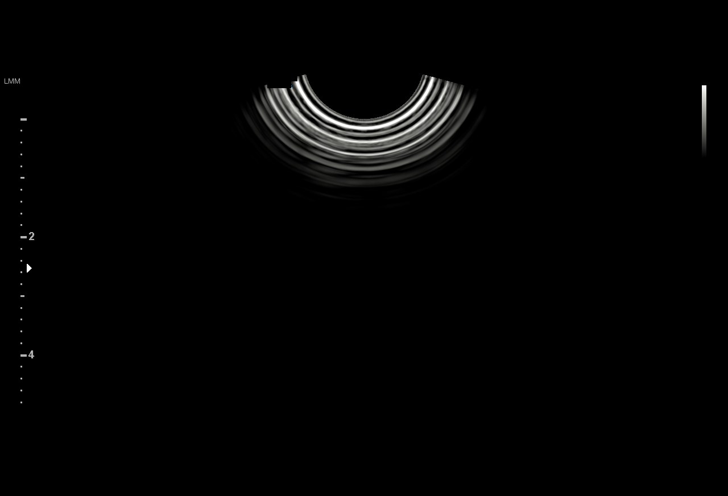
[im 44/58]
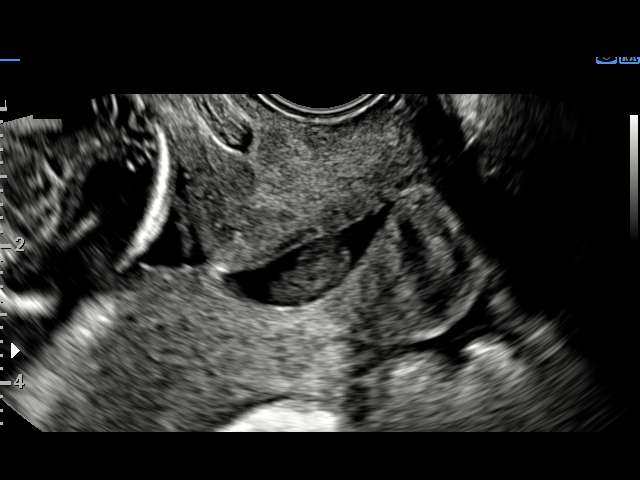
[im 49/58]
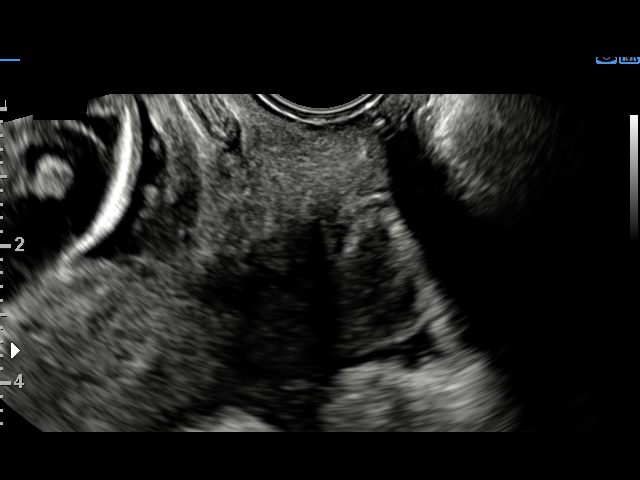
[im 55/58]
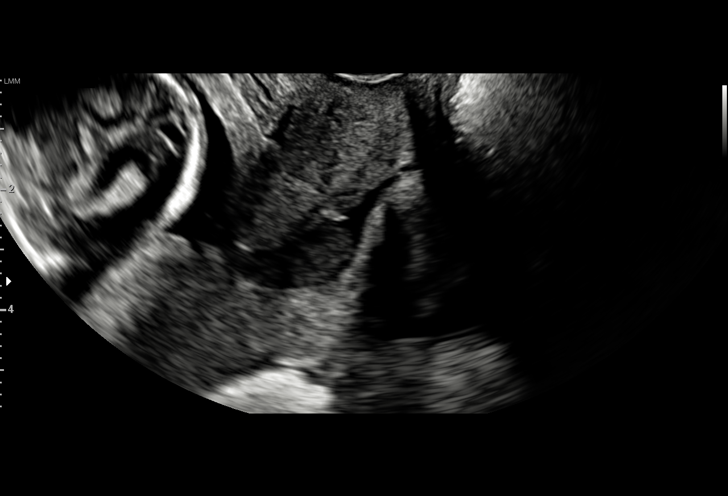

[13 of 28 positions shown; findings below may reference images not displayed]

[facility]

[REDACTED]
Ave[address] [HOSPITAL]

1  KAKI JIM            [id number]      [id number]     [id number]
2  KAKI JIM            [id number]      [id number]     [id number]
Indications

16 weeks gestation of pregnancy
Diabetes - Pregestational, 2nd trimester
(Lantus and Novolog)
Hypertension - Chronic/Pre-existing
(Procardia)
Encounter for fetal anatomic survey
OB History

Gravidity:    1
Fetal Evaluation

Num Of Fetuses:     1
Fetal Heart         149
Rate(bpm):
Cardiac Activity:   Observed
Presentation:       Cephalic
Placenta:           Anterior, above cervical os
P. Cord Insertion:  Not well visualized

Amniotic Fluid
AFI FV:      Subjectively within normal limits

Largest Pocket(cm)
4.7
Biometry
BPD:      35.4  mm     G. Age:  16w 6d         50  %    CI:        72.25   %   70 - 86
FL/HC:      16.3   %   14.6 -
HC:      132.5  mm     G. Age:  16w 6d         37  %    HC/AC:      1.15       1.07 -
AC:      115.7  mm     G. Age:  17w 3d         66  %    FL/BPD:     61.0   %
FL:       21.6  mm     G. Age:  16w 3d         30  %    FL/AC:      18.7   %   20 - 24
HUM:      20.8  mm     G. Age:  16w 2d         40  %
CER:      16.3  mm     G. Age:  16w 2d         37  %

Est. FW:     174  gm      0 lb 6 oz     58  %
Gestational Age

LMP:           18w 0d       Date:   08/31/16                 EDD:   06/07/17
U/S Today:     16w 6d                                        EDD:   06/15/17
Best:          16w 6d    Det. By:   Early Ultrasound         EDD:   06/15/17
(11/30/16)
Anatomy

Cranium:               Appears normal         Aortic Arch:            Not well visualized
Cavum:                 Appears normal         Ductal Arch:            Not well visualized
Ventricles:            Appears normal         Diaphragm:              Appears normal
Choroid Plexus:        Appears normal         Stomach:                Appears normal, left
sided
Cerebellum:            Not well visualized    Abdomen:                Appears normal
Posterior Fossa:       Not well visualized    Abdominal Wall:         Appears nml (cord
insert, abd wall)
Nuchal Fold:           Not well visualized    Cord Vessels:           Not well visualized
Face:                  Not well visualized    Kidneys:                Appear normal
Lips:                  Not well visualized    Bladder:                Appears normal
Thoracic:              Appears normal         Spine:                  Not well visualized
Heart:                 Not well visualized    Upper Extremities:      Visualized
RVOT:                  Not well visualized    Lower Extremities:      Visualized
LVOT:                  Not well visualized

Other:  Technically difficult due to early gestational age and fetal position.
Cervix Uterus Adnexa

Cervix
Appears dilated, see comments. Ext os 1.9 mm dilated. Debris noted
in EC canal.

Uterus
No abnormality visualized.

Left Ovary
Not visualized.

Right Ovary
Within normal limits.

Cul De Sac:   No free fluid seen.

Adnexa:       No abnormality visualized.
Impression

Singleton intrauterine pregnancy at 16+6 weeks with type 2
DM and CHTN
Review of the anatomy shows no sonographic markers for
aneuploidy or structural anomalies
However, the evaluations should be considered suboptimal
secondary to early gestational age, maternal body habitus
and fetal position
Amniotic fluid volume is normal
Estimated fetal weight is 174g which is growth in the 58th
percentile
the cervix has along funnel with debris. the closed cervical
length is 6mm
Recommendations

Discussed case with Dr. [REDACTED] and recommended
cerclage early next week at the latest. Patient will contact
their office today for appointment. F/U here to assess
cerclage in 3 weeks

## 2019-07-26 ENCOUNTER — Ambulatory Visit (HOSPITAL_COMMUNITY)
Admission: EM | Admit: 2019-07-26 | Discharge: 2019-07-26 | Disposition: A | Discharge status: Home / Self Care | Payer: Medicaid Other | Attending: Family Medicine | Admitting: Family Medicine

## 2019-07-26 ENCOUNTER — Encounter (HOSPITAL_COMMUNITY): Payer: Self-pay | Admitting: Emergency Medicine

## 2019-07-26 ENCOUNTER — Other Ambulatory Visit: Payer: Self-pay

## 2019-07-26 DIAGNOSIS — L02212 Cutaneous abscess of back [any part, except buttock]: Secondary | ICD-10-CM | POA: Diagnosis present

## 2019-07-26 MED ORDER — SULFAMETHOXAZOLE-TRIMETHOPRIM 800-160 MG PO TABS: 1.0000 | ORAL_TABLET | Freq: Two times a day (BID) | ORAL | 0 refills | Status: AC

## 2019-07-26 MED ORDER — TRAMADOL HCL 50 MG PO TABS: 50.0000 mg | ORAL_TABLET | Freq: Four times a day (QID) | ORAL | 0 refills | Status: DC | PRN

## 2019-07-26 NOTE — ED Provider Notes (Signed)
[facility]    CSN: [id number] Arrival date & time: 07/26/19  1544      History   Chief Complaint Chief Complaint  Patient presents with  . Abscess    HPI Megan Ibarra is a 28 y.o. female.   HPI  Patient has been having recurring skin infections.  She states that her boyfriend and her child have also.  She would like to be tested for MRSA.  Currently has a abscess on her back.  By the right shoulder blade.  There is a large pustule there.  She states it keeps opening up and draining a little bit, then closing over and filling back up again.  No fever.  She has not been treated for her infections.  Past Medical History:  Diagnosis Date  . Diabetes mellitus without complication (HCC)   . Hypertension     Patient Active Problem List   Diagnosis Date Noted  . SVD (spontaneous vaginal delivery) 06/08/2017  . Laceration of vaginal wall or sulcus without perineal laceration during delivery 06/08/2017  . Chronic hypertension in pregnancy 06/08/2017  . Normal labor 06/07/2017  . Uncontrolled diabetes mellitus (HCC) 11/30/2016    Past Surgical History:  Procedure Laterality Date  . NO PAST SURGERIES      OB History    Gravida  1   Para  1   Term  1   Preterm      AB      Living  1     SAB      TAB      Ectopic      Multiple  0   Live Births  1            Home Medications    Prior to Admission medications   Medication Sig Start Date End Date Taking? Authorizing Provider  acetaminophen (TYLENOL) 325 MG tablet Take 2 tablets (650 mg total) by mouth every 6 (six) hours as needed. 11/09/17  Yes Wieters, Hallie C, PA-C  ibuprofen (ADVIL,MOTRIN) 600 MG tablet Take 1 tablet (600 mg total) by mouth every 6 (six) hours as needed. 11/09/17   Wieters, Hallie C, PA-C  labetalol (NORMODYNE) 200 MG tablet Take 1 tablet (200 mg total) by mouth 3 (three) times daily. 06/10/17   Gerald Leitz, MD  metFORMIN (GLUCOPHAGE) 500 MG tablet Take 1 tablet (500  mg total) by mouth 2 (two) times daily with a meal. 06/10/17   Gerald Leitz, MD  NIFEdipine (PROCARDIA XL/ADALAT-CC) 90 MG 24 hr tablet Take 1 tablet (90 mg total) by mouth daily. 06/10/17   Gerald Leitz, MD  Prenatal Multivit-Min-Fe-FA (PRENATAL VITAMINS) 0.8 MG tablet Take 1 tablet by mouth daily. 10/07/16   Constant, Peggy, MD  sulfamethoxazole-trimethoprim (BACTRIM DS) 800-160 MG tablet Take 1 tablet by mouth 2 (two) times daily for 7 days. 07/26/19 08/02/19  Eustace Moore, MD  traMADol (ULTRAM) 50 MG tablet Take 1 tablet (50 mg total) by mouth every 6 (six) hours as needed. 07/26/19   Eustace Moore, MD    Family History Family History  Problem Relation Age of Onset  . Hypertension Mother   . Diabetes Mother   . Hypertension Maternal Grandmother   . Diabetes Maternal Grandmother     Social History Social History   Tobacco Use  . Smoking status: Former Smoker    Packs/day: 0.25    Types: Cigarettes    Quit date: 07/02/2016    Years since quitting: 3.0  . Smokeless tobacco:  Never Used  Substance Use Topics  . Alcohol use: No  . Drug use: No     Allergies   Latex   Review of Systems Review of Systems  Constitutional: Negative for chills and fever.  HENT: Negative for ear pain and sore throat.   Eyes: Negative for pain and visual disturbance.  Respiratory: Negative for cough and shortness of breath.   Cardiovascular: Negative for chest pain and palpitations.  Gastrointestinal: Negative for abdominal pain and vomiting.  Genitourinary: Negative for dysuria and hematuria.  Musculoskeletal: Negative for arthralgias and back pain.  Skin: Positive for wound. Negative for color change and rash.  Neurological: Negative for seizures and syncope.  All other systems reviewed and are negative.    Physical Exam Triage Vital Signs ED Triage Vitals  Enc Vitals Group     BP 07/26/19 1612 (!) 176/106     Pulse Rate 07/26/19 1612 (!) 120     Resp 07/26/19 1612 18     Temp  07/26/19 1612 99 F (37.2 C)     Temp Source 07/26/19 1612 Oral     SpO2 07/26/19 1612 98 %     Weight --      Height --      Head Circumference --      Peak Flow --      Pain Score 07/26/19 1611 10     Pain Loc --      Pain Edu? --      Excl. in GC? --    No data found.  Updated Vital Signs BP (!) 164/99 (BP Location: Left Arm)   Pulse (!) 120   Temp 99 F (37.2 C) (Oral)   Resp 18   LMP 07/16/2019 (Approximate)   SpO2 98%     Physical Exam Constitutional:      General: She is not in acute distress.    Appearance: She is well-developed. She is obese.     Comments: Appears uncomfortable  HENT:     Head: Normocephalic and atraumatic.  Eyes:     Conjunctiva/sclera: Conjunctivae normal.     Pupils: Pupils are equal, round, and reactive to light.  Neck:     Musculoskeletal: Normal range of motion.  Cardiovascular:     Rate and Rhythm: Normal rate.  Pulmonary:     Effort: Pulmonary effort is normal. No respiratory distress.  Abdominal:     General: There is no distension.     Palpations: Abdomen is soft.  Musculoskeletal: Normal range of motion.  Skin:    General: Skin is warm and dry.     Comments: over right shoulder blade area there is an erythematous indurated area measures 5 cm across.  No fluctuance.  In the center of this is a large pustule about 1 cm across.  This is opened with a 18-gauge needle.  Small amount purulence is expressed.  Culture obtained  Neurological:     Mental Status: She is alert.      UC Treatments / Results  Labs (all labs ordered are listed, but only abnormal results are displayed) Labs Reviewed  AEROBIC CULTURE (SUPERFICIAL SPECIMEN)    EKG   Radiology No results found.  Procedures Procedures (including critical care time)  Medications Ordered in UC Medications - No data to display  Initial Impression / Assessment and Plan / UC Course  I have reviewed the triage vital signs and the nursing notes.  Pertinent labs &  imaging results that were available during my care of  the patient were reviewed by me and considered in my medical decision making (see chart for details).     This area appears to have purulence, but it does not have fluctuance indicating necessity of I&D.  I am reluctant to do an I&D.  I would have her take antibiotics and do warm compresses, will have her come back if this does not resolve. Final Clinical Impressions(s) / UC Diagnoses   Final diagnoses:  Cutaneous abscess of back (any part, except buttock)     Discharge Instructions     Warm compresses to back Take the antibiotic 2 x a day Take pain medicine as needed I have sent a culture, to check for MRSA   ED Prescriptions    Medication Sig Dispense Auth. Provider   sulfamethoxazole-trimethoprim (BACTRIM DS) 800-160 MG tablet Take 1 tablet by mouth 2 (two) times daily for 7 days. 14 tablet Raylene Everts, MD   traMADol (ULTRAM) 50 MG tablet Take 1 tablet (50 mg total) by mouth every 6 (six) hours as needed. 15 tablet Raylene Everts, MD     I have reviewed the PDMP during this encounter.   Raylene Everts, MD 07/26/19 Einar Crow

## 2019-07-26 NOTE — Discharge Instructions (Signed)
Warm compresses to back Take the antibiotic 2 x a day Take pain medicine as needed I have sent a culture, to check for MRSA

## 2019-07-26 NOTE — ED Triage Notes (Signed)
Pt first noticed what she thought might be a bite to her left upper back and it has grown in size.  The area is swollen with a large white head on it.  She states a few days later she developed another area on her right lower back.  This one is not swollen and does not have a head on it.

## 2019-07-28 ENCOUNTER — Telehealth (HOSPITAL_COMMUNITY): Payer: Self-pay | Admitting: Emergency Medicine

## 2019-07-28 LAB — AEROBIC CULTURE W GRAM STAIN (SUPERFICIAL SPECIMEN): Gram Stain: NONE SEEN

## 2019-07-28 NOTE — Telephone Encounter (Signed)
Attempted to reach patient to discuss wound culture results, no answer.

## 2020-06-27 ENCOUNTER — Ambulatory Visit: Payer: Medicaid Other | Attending: Internal Medicine

## 2020-06-27 DIAGNOSIS — Z23 Encounter for immunization: Secondary | ICD-10-CM

## 2020-06-27 NOTE — Progress Notes (Signed)
   Covid-19 Vaccination [facility]  Name:  AKAYA PROFFIT    MRN: [id number] DOB: 12-01-1990  06/27/2020  Ms. Lozito was observed post Covid-19 immunization for 15 minutes without incident. She was provided with Vaccine Information Sheet and instruction to access the V-Safe system.   Ms. Worrall was instructed to call 911 with any severe reactions post vaccine: Marland Kitchen Difficulty breathing  . Swelling of face and throat  . A fast heartbeat  . A bad rash all over body  . Dizziness and weakness   Immunizations Administered    Name Date Dose VIS Date Route   Pfizer COVID-19 Vaccine 06/27/2020 10:15 AM 0.3 mL 11/11/2018 Intramuscular   Manufacturer: [employer], [employer]   Lot: [id number]   NDC: [address]-0

## 2021-01-26 ENCOUNTER — Other Ambulatory Visit: Payer: Self-pay

## 2021-01-26 ENCOUNTER — Ambulatory Visit (HOSPITAL_COMMUNITY)
Admission: EM | Admit: 2021-01-26 | Discharge: 2021-01-26 | Disposition: A | Discharge status: Home / Self Care | Payer: Medicaid Other | Attending: Internal Medicine | Admitting: Internal Medicine

## 2021-01-26 ENCOUNTER — Encounter (HOSPITAL_COMMUNITY): Payer: Self-pay | Admitting: Emergency Medicine

## 2021-01-26 DIAGNOSIS — N939 Abnormal uterine and vaginal bleeding, unspecified: Secondary | ICD-10-CM | POA: Insufficient documentation

## 2021-01-26 LAB — CBC
HCT: 38.7 % (ref 36.0–46.0)
Hemoglobin: 12.4 g/dL (ref 12.0–15.0)
MCH: 27.1 pg (ref 26.0–34.0)
MCHC: 32 g/dL (ref 30.0–36.0)
MCV: 84.7 fL (ref 80.0–100.0)
Platelets: 480 10*3/uL — ABNORMAL HIGH (ref 150–400)
RBC: 4.57 MIL/uL (ref 3.87–5.11)
RDW: 12 % (ref 11.5–15.5)
WBC: 11 10*3/uL — ABNORMAL HIGH (ref 4.0–10.5)
nRBC: 0 % (ref 0.0–0.2)

## 2021-01-26 LAB — POC URINE PREG, ED: Preg Test, Ur: NEGATIVE

## 2021-01-26 MED ORDER — IBUPROFEN 600 MG PO TABS: 600.0000 mg | ORAL_TABLET | Freq: Four times a day (QID) | ORAL | 0 refills | Status: DC | PRN

## 2021-01-26 MED ORDER — NORETHIN ACE-ETH ESTRAD-FE 1-20 MG-MCG PO TABS: 1.0000 | ORAL_TABLET | Freq: Every day | ORAL | 0 refills | Status: DC

## 2021-01-26 MED ORDER — TIZANIDINE HCL 4 MG PO TABS: 4.0000 mg | ORAL_TABLET | Freq: Every day | ORAL | 0 refills | Status: DC

## 2021-01-26 NOTE — ED Triage Notes (Addendum)
april 26 started having vaginal bleeding, clots.  This lasted for 3 days.  Continued with cramping .  This past Monday, bleeding started again, very light.  Now bleeding is very heavy.  Pain in lower abdomen.  Intermittent back pain  Last normal period was April 17.

## 2021-01-26 NOTE — ED Provider Notes (Signed)
[facility]    CSN: [id number] Arrival date & time: 01/26/21  1547      History   Chief Complaint Chief Complaint  Patient presents with  . Vaginal Bleeding    HPI Megan Ibarra is a 30 y.o. female.   Patient presents with vaginal bleeding for 4 days. Started off light but has now become heavy. Has used approximately three pads today. Having large golf ball sized clots, pictures shown to provider. Endorses lower abdominal pain, decreased appetite and nausea. This occurred on April 26, lasting 3 days. Last normal menses April 17th. Patient has been stressed due to death in the family. Possibility of pregnancy, one partner, no condom use. Denies familial history of clotting disorders, grandmother had CVA 20 years ago. History of HTN and DM. Not taking HTN medication as prescribed, not taking diabetes medication at all. Denies tobacco use.   Past Medical History:  Diagnosis Date  . Diabetes mellitus without complication (HCC)   . Hypertension     Patient Active Problem List   Diagnosis Date Noted  . SVD (spontaneous vaginal delivery) 06/08/2017  . Laceration of vaginal wall or sulcus without perineal laceration during delivery 06/08/2017  . Chronic hypertension in pregnancy 06/08/2017  . Normal labor 06/07/2017  . Uncontrolled diabetes mellitus (HCC) 11/30/2016    Past Surgical History:  Procedure Laterality Date  . NO PAST SURGERIES      OB History    Gravida  1   Para  1   Term  1   Preterm      AB      Living  1     SAB      IAB      Ectopic      Multiple  0   Live Births  1            Home Medications    Prior to Admission medications   Medication Sig Start Date End Date Taking? Authorizing Provider  labetalol (NORMODYNE) 200 MG tablet Take 1 tablet (200 mg total) by mouth 3 (three) times daily. 06/10/17  Yes Gerald Leitz, MD  metFORMIN (GLUCOPHAGE) 500 MG tablet Take 1 tablet (500 mg total) by mouth 2 (two) times daily with  a meal. 06/10/17  Yes Gerald Leitz, MD  NIFEdipine (PROCARDIA XL/ADALAT-CC) 90 MG 24 hr tablet Take 1 tablet (90 mg total) by mouth daily. 06/10/17  Yes Gerald Leitz, MD  norethindrone-ethinyl estradiol-FE (LOESTRIN FE 1/20) 1-20 MG-MCG tablet Take 1 tablet by mouth daily. 01/26/21  Yes Amia Rynders, Elita Boone, NP  acetaminophen (TYLENOL) 325 MG tablet Take 2 tablets (650 mg total) by mouth every 6 (six) hours as needed. 11/09/17   Wieters, Hallie C, PA-C  ibuprofen (ADVIL,MOTRIN) 600 MG tablet Take 1 tablet (600 mg total) by mouth every 6 (six) hours as needed. 11/09/17   Wieters, Hallie C, PA-C  Prenatal Multivit-Min-Fe-FA (PRENATAL VITAMINS) 0.8 MG tablet Take 1 tablet by mouth daily. 10/07/16   Constant, Peggy, MD  traMADol (ULTRAM) 50 MG tablet Take 1 tablet (50 mg total) by mouth every 6 (six) hours as needed. Patient not taking: Reported on 01/26/2021 07/26/19   Eustace Moore, MD    Family History Family History  Problem Relation Age of Onset  . Hypertension Mother   . Diabetes Mother   . Hypertension Maternal Grandmother   . Diabetes Maternal Grandmother     Social History Social History   Tobacco Use  . Smoking status: Former Smoker  Packs/day: 0.25    Types: Cigarettes    Quit date: 07/02/2016    Years since quitting: 4.5  . Smokeless tobacco: Never Used  Vaping Use  . Vaping Use: Never used  Substance Use Topics  . Alcohol use: Yes  . Drug use: No     Allergies   Latex   Review of Systems Review of Systems  Constitutional: Negative.   HENT: Negative.   Respiratory: Negative.   Cardiovascular: Negative.   Gastrointestinal: Positive for abdominal pain. Negative for abdominal distention, anal bleeding, blood in stool, constipation, diarrhea, nausea and rectal pain.  Genitourinary: Positive for vaginal bleeding. Negative for decreased urine volume, difficulty urinating, dyspareunia, dysuria, enuresis, flank pain, frequency, genital sores, hematuria, menstrual problem,  pelvic pain, urgency, vaginal discharge and vaginal pain.  Neurological: Negative.      Physical Exam Triage Vital Signs ED Triage Vitals  Enc Vitals Group     BP 01/26/21 1658 (!) 179/114     Pulse Rate 01/26/21 1658 (!) 109     Resp 01/26/21 1658 18     Temp 01/26/21 1658 99.8 F (37.7 C)     Temp Source 01/26/21 1658 Oral     SpO2 01/26/21 1658 97 %     Weight --      Height --      Head Circumference --      Peak Flow --      Pain Score 01/26/21 1655 8     Pain Loc --      Pain Edu? --      Excl. in GC? --    No data found.  Updated Vital Signs BP (!) 179/114 (BP Location: Right Arm) Comment (BP Location): large cuff/not taking medicine  Pulse (!) 109   Temp 99.8 F (37.7 C) (Oral)   Resp 18   LMP 01/01/2021   SpO2 97%   Visual Acuity Right Eye Distance:   Left Eye Distance:   Bilateral Distance:    Right Eye Near:   Left Eye Near:    Bilateral Near:     Physical Exam Constitutional:      Appearance: Normal appearance.  HENT:     Head: Normocephalic.  Eyes:     Extraocular Movements: Extraocular movements intact.  Cardiovascular:     Rate and Rhythm: Normal rate.     Pulses: Normal pulses.     Heart sounds: Normal heart sounds.  Pulmonary:     Effort: Pulmonary effort is normal.     Breath sounds: Normal breath sounds.  Abdominal:     General: Abdomen is flat. Bowel sounds are normal.     Palpations: Abdomen is soft. There is no mass or pulsatile mass.     Tenderness: There is abdominal tenderness in the right upper quadrant, right lower quadrant, periumbilical area and suprapubic area. There is no right CVA tenderness, left CVA tenderness, guarding or rebound.     Hernia: No hernia is present.  Musculoskeletal:        General: Normal range of motion.     Cervical back: Normal range of motion.  Skin:    General: Skin is warm and dry.  Neurological:     General: No focal deficit present.     Mental Status: She is alert and oriented to person,  place, and time. Mental status is at baseline.  Psychiatric:        Mood and Affect: Mood normal.        Behavior: Behavior normal.  Thought Content: Thought content normal.        Judgment: Judgment normal.      UC Treatments / Results  Labs (all labs ordered are listed, but only abnormal results are displayed) Labs Reviewed  CBC  POC URINE PREG, ED    EKG   Radiology No results found.  Procedures Procedures (including critical care time)  Medications Ordered in UC Medications - No data to display  Initial Impression / Assessment and Plan / UC Course  I have reviewed the triage vital signs and the nursing notes.  Pertinent labs & imaging results that were available during my care of the patient were reviewed by me and considered in my medical decision making (see chart for details).  Abnormal vaginal bleeding  1, Loestrin fe tid for 7 days, then daily until follow up wit gyencology 2. [facility] referral given to patient 3. CBC pending, will notify of any concerning labs 4. Ibuprofen 600 mg tid prn 5. zanaflex 4 mg at bedtime prn  7. Strict return precautions given for follow up in ED, verbalized understanding   Final Clinical Impressions(s) / UC Diagnoses   Final diagnoses:  Abnormal vaginal bleeding     Discharge Instructions     Take one pill three times a day for 7 days then take one pill daily until follow up with gynecologist  [facility] info is below  Labs pending, you will be notified for any concerning value  If increased bleeding occurs, increased abdominal pain, vomiting, fever, chills, please go to the emergency department for evaluation     ED Prescriptions    Medication Sig Dispense Auth. Provider   norethindrone-ethinyl estradiol-FE (LOESTRIN FE 1/20) 1-20 MG-MCG tablet Take 1 tablet by mouth daily. 84 tablet Malachi Kinzler, Elita Boone, NP     PDMP not reviewed this encounter.   Valinda Hoar, NP 01/26/21 1753

## 2021-01-26 NOTE — Discharge Instructions (Addendum)
Take one pill three times a day for 7 days then take one pill daily until follow up with gynecologist  [facility] info is below  Labs pending, you will be notified for any concerning value  If increased bleeding occurs, increased abdominal pain, vomiting, fever, chills, please go to the emergency department for evaluation

## 2022-02-08 ENCOUNTER — Ambulatory Visit
Admission: RE | Admit: 2022-02-08 | Discharge: 2022-02-08 | Disposition: A | Discharge status: Home / Self Care | Payer: Medicaid Other | Source: Ambulatory Visit | Attending: Sports Medicine | Admitting: Sports Medicine

## 2022-02-08 ENCOUNTER — Other Ambulatory Visit: Payer: Self-pay | Admitting: Sports Medicine

## 2022-02-08 DIAGNOSIS — M79644 Pain in right finger(s): Secondary | ICD-10-CM

## 2023-03-30 ENCOUNTER — Other Ambulatory Visit: Payer: Self-pay

## 2023-03-30 ENCOUNTER — Encounter (HOSPITAL_COMMUNITY): Payer: Self-pay | Admitting: *Deleted

## 2023-03-30 ENCOUNTER — Emergency Department (HOSPITAL_COMMUNITY): Payer: Medicaid Other

## 2023-03-30 ENCOUNTER — Emergency Department (HOSPITAL_COMMUNITY)
Admission: EM | Admit: 2023-03-30 | Discharge: 2023-03-30 | Disposition: A | Discharge status: Home / Self Care | Payer: Medicaid Other | Attending: Emergency Medicine | Admitting: Emergency Medicine

## 2023-03-30 DIAGNOSIS — E119 Type 2 diabetes mellitus without complications: Secondary | ICD-10-CM | POA: Insufficient documentation

## 2023-03-30 DIAGNOSIS — Z91040 Latex allergy status: Secondary | ICD-10-CM | POA: Diagnosis not present

## 2023-03-30 DIAGNOSIS — N7091 Salpingitis, unspecified: Secondary | ICD-10-CM | POA: Insufficient documentation

## 2023-03-30 DIAGNOSIS — Z7984 Long term (current) use of oral hypoglycemic drugs: Secondary | ICD-10-CM | POA: Insufficient documentation

## 2023-03-30 DIAGNOSIS — N7093 Salpingitis and oophoritis, unspecified: Secondary | ICD-10-CM

## 2023-03-30 DIAGNOSIS — R1032 Left lower quadrant pain: Secondary | ICD-10-CM | POA: Diagnosis present

## 2023-03-30 LAB — WET PREP, GENITAL
Sperm: NONE SEEN
Trich, Wet Prep: NONE SEEN
WBC, Wet Prep HPF POC: >=10 — AB (ref <10)
Yeast Wet Prep HPF POC: NONE SEEN

## 2023-03-30 LAB — COMPREHENSIVE METABOLIC PANEL
ALT: 15 U/L (ref 0–44)
AST: 17 U/L (ref 15–41)
Albumin: 3.4 g/dL — ABNORMAL LOW (ref 3.5–5.0)
Alkaline Phosphatase: 52 U/L (ref 38–126)
Anion gap: 8 (ref 5–15)
BUN: 7 mg/dL (ref 6–20)
CO2: 24 mmol/L (ref 22–32)
Calcium: 9.2 mg/dL (ref 8.9–10.3)
Chloride: 102 mmol/L (ref 98–111)
Creatinine, Ser: 0.76 mg/dL (ref 0.44–1.00)
GFR, Estimated: >60 mL/min (ref >60)
Glucose, Bld: 285 mg/dL — ABNORMAL HIGH (ref 70–99)
Potassium: 3.9 mmol/L (ref 3.5–5.1)
Sodium: 134 mmol/L — ABNORMAL LOW (ref 135–145)
Total Bilirubin: 0.5 mg/dL (ref 0.3–1.2)
Total Protein: 7.1 g/dL (ref 6.5–8.1)

## 2023-03-30 LAB — CBC
HCT: 33.5 % — ABNORMAL LOW (ref 36.0–46.0)
Hemoglobin: 10.1 g/dL — ABNORMAL LOW (ref 12.0–15.0)
MCH: 22.5 pg — ABNORMAL LOW (ref 26.0–34.0)
MCHC: 30.1 g/dL (ref 30.0–36.0)
MCV: 74.8 fL — ABNORMAL LOW (ref 80.0–100.0)
Platelets: 399 10*3/uL (ref 150–400)
RBC: 4.48 MIL/uL (ref 3.87–5.11)
RDW: 14.3 % (ref 11.5–15.5)
WBC: 7.7 10*3/uL (ref 4.0–10.5)
nRBC: 0 % (ref 0.0–0.2)

## 2023-03-30 LAB — URINALYSIS, ROUTINE W REFLEX MICROSCOPIC
Bacteria, UA: NONE SEEN
Bilirubin Urine: NEGATIVE
Glucose, UA: >=500 mg/dL — AB
Hgb urine dipstick: NEGATIVE
Ketones, ur: 5 mg/dL — AB
Nitrite: NEGATIVE
Protein, ur: NEGATIVE mg/dL
Specific Gravity, Urine: 1.023 (ref 1.005–1.030)
pH: 8 (ref 5.0–8.0)

## 2023-03-30 LAB — LIPASE, BLOOD: Lipase: 46 U/L (ref 11–51)

## 2023-03-30 LAB — RPR: RPR Ser Ql: NONREACTIVE

## 2023-03-30 LAB — HIV ANTIBODY (ROUTINE TESTING W REFLEX): HIV Screen 4th Generation wRfx: NONREACTIVE

## 2023-03-30 LAB — HCG, SERUM, QUALITATIVE: Preg, Serum: NEGATIVE

## 2023-03-30 MED ORDER — IOHEXOL 350 MG/ML SOLN
75.0000 mL | Freq: Once | INTRAVENOUS | Status: AC | PRN
  Administered 2023-03-30: 75 mL via INTRAVENOUS

## 2023-03-30 MED ORDER — METRONIDAZOLE 500 MG PO TABS: 500.0000 mg | ORAL_TABLET | Freq: Two times a day (BID) | ORAL | 0 refills | Status: DC

## 2023-03-30 MED ORDER — SODIUM CHLORIDE 0.9 % IV SOLN
2.0000 g | Freq: Once | INTRAVENOUS | Status: AC
  Administered 2023-03-30: 2 g via INTRAVENOUS
  Filled 2023-03-30: qty 20

## 2023-03-30 MED ORDER — ACETAMINOPHEN 500 MG PO TABS
1000.0000 mg | ORAL_TABLET | Freq: Once | ORAL | Status: AC
  Administered 2023-03-30: 1000 mg via ORAL
  Filled 2023-03-30: qty 2

## 2023-03-30 MED ORDER — ONDANSETRON 4 MG PO TBDP
4.0000 mg | ORAL_TABLET | Freq: Once | ORAL | Status: AC | PRN
  Administered 2023-03-30: 4 mg via ORAL
  Filled 2023-03-30: qty 1

## 2023-03-30 MED ORDER — SODIUM CHLORIDE 0.9 % IV BOLUS
1000.0000 mL | Freq: Once | INTRAVENOUS | Status: AC
  Administered 2023-03-30: 1000 mL via INTRAVENOUS

## 2023-03-30 MED ORDER — DOXYCYCLINE HYCLATE 100 MG PO CAPS: 100.0000 mg | ORAL_CAPSULE | Freq: Two times a day (BID) | ORAL | 0 refills | Status: DC

## 2023-03-30 NOTE — ED Triage Notes (Signed)
C/o right flank pain [facility] 7/8 after working out. Not going away

## 2023-03-30 NOTE — ED Provider Notes (Signed)
[place] EMERGENCY DEPARTMENT AT [facility] Provider Note   CSN: [id number] Arrival date & time: 03/30/23  0442     History  Chief Complaint  Patient presents with   Abdominal Pain    PAMLA DEMLER is a 32 y.o. female history of diabetes presented with 1 week of lower abdominal pain.  Patient states lower abdominal pain began in the left lower quadrant went to her bellybutton nose and her right lower quadrant.  Patient states she still has her gallbladder and appendix.  Patient denies any fevers at home but does note that she has had 3 bowel movements over the past week and that she was eating and drinking okay.  Patient does not tried any laxatives at this time.  Patient endorsed nonbloody emesis over the past week but received Zofran in triage and is currently not nauseous.  Patient denied chest pain, shortness of breath, change in sensation/motor skills, hematuria, vaginal bleeding, vaginal discharge, dysuria, flank pain  Home Medications Prior to Admission medications   Medication Sig Start Date End Date Taking? Authorizing Provider  doxycycline (VIBRAMYCIN) 100 MG capsule Take 1 capsule (100 mg total) by mouth 2 (two) times daily. 03/30/23  Yes Harleen Fineberg, Beverly Gust, PA-C  metroNIDAZOLE (FLAGYL) 500 MG tablet Take 1 tablet (500 mg total) by mouth 2 (two) times daily. 03/30/23  Yes Yahel Fuston, Beverly Gust, PA-C  acetaminophen (TYLENOL) 325 MG tablet Take 2 tablets (650 mg total) by mouth every 6 (six) hours as needed. 11/09/17   Wieters, Hallie C, PA-C  ibuprofen (ADVIL) 600 MG tablet Take 1 tablet (600 mg total) by mouth every 6 (six) hours as needed. 01/26/21   White, Elita Boone, NP  labetalol (NORMODYNE) 200 MG tablet Take 1 tablet (200 mg total) by mouth 3 (three) times daily. 06/10/17   Gerald Leitz, MD  metFORMIN (GLUCOPHAGE) 500 MG tablet Take 1 tablet (500 mg total) by mouth 2 (two) times daily with a meal. 06/10/17   Gerald Leitz, MD  NIFEdipine (PROCARDIA XL/ADALAT-CC) 90 MG  24 hr tablet Take 1 tablet (90 mg total) by mouth daily. 06/10/17   Gerald Leitz, MD  norethindrone-ethinyl estradiol-FE (LOESTRIN FE 1/20) 1-20 MG-MCG tablet Take 1 tablet by mouth daily. 01/26/21   White, Elita Boone, NP  Prenatal Multivit-Min-Fe-FA (PRENATAL VITAMINS) 0.8 MG tablet Take 1 tablet by mouth daily. 10/07/16   Constant, Peggy, MD  tiZANidine (ZANAFLEX) 4 MG tablet Take 1 tablet (4 mg total) by mouth at bedtime. 01/26/21   White, Elita Boone, NP  traMADol (ULTRAM) 50 MG tablet Take 1 tablet (50 mg total) by mouth every 6 (six) hours as needed. Patient not taking: Reported on 01/26/2021 07/26/19   Eustace Moore, MD      Allergies    Latex    Review of Systems   Review of Systems  Gastrointestinal:  Positive for abdominal pain.    Physical Exam Updated Vital Signs BP (!) 147/89 (BP Location: Right Arm)   Pulse 92   Temp 98.2 F (36.8 C) (Oral)   Resp 18   Ht 5\' 5"  (1.651 m)   Wt 98.9 kg   SpO2 99%   BMI 36.28 kg/m  Physical Exam Vitals reviewed. Exam conducted with a chaperone present.  Constitutional:      General: She is not in acute distress. HENT:     Head: Normocephalic and atraumatic.  Eyes:     Extraocular Movements: Extraocular movements intact.     Conjunctiva/sclera: Conjunctivae normal.  Pupils: Pupils are equal, round, and reactive to light.  Cardiovascular:     Rate and Rhythm: Normal rate and regular rhythm.     Pulses: Normal pulses.     Heart sounds: Normal heart sounds.     Comments: 2+ bilateral radial/dorsalis pedis pulses with regular rate Pulmonary:     Effort: Pulmonary effort is normal. No respiratory distress.     Breath sounds: Normal breath sounds.  Abdominal:     Palpations: Abdomen is soft.     Tenderness: There is abdominal tenderness in the right lower quadrant, suprapubic area and left lower quadrant. There is no guarding or rebound. Negative signs include Murphy's sign, Rovsing's sign, McBurney's sign and psoas sign.   Genitourinary:    Exam position: Lithotomy position.     Labia:        Right: No rash, tenderness, lesion or injury.        Left: No rash, tenderness, lesion or injury.      Vagina: Normal.     Cervix: Normal. No cervical motion tenderness, discharge, lesion or cervical bleeding.     Uterus: Normal.      Adnexa: Left adnexa normal.       Right: Tenderness present.      Comments: Chaperone: Delrae Alfred, RN  Musculoskeletal:        General: Normal range of motion.     Cervical back: Normal range of motion and neck supple.     Comments: 5 out of 5 bilateral grip/leg extension strength  Skin:    General: Skin is warm and dry.     Capillary Refill: Capillary refill takes less than 2 seconds.  Neurological:     General: No focal deficit present.     Mental Status: She is alert and oriented to person, place, and time.     Comments: Sensation intact in all 4 limbs  Psychiatric:        Mood and Affect: Mood normal.     ED Results / Procedures / Treatments   Labs (all labs ordered are listed, but only abnormal results are displayed) Labs Reviewed  WET PREP, GENITAL - Abnormal; Notable for the following components:      Result Value   Clue Cells Wet Prep HPF POC PRESENT (*)    WBC, Wet Prep HPF POC >=10 (*)    All other components within normal limits  COMPREHENSIVE METABOLIC PANEL - Abnormal; Notable for the following components:   Sodium 134 (*)    Glucose, Bld 285 (*)    Albumin 3.4 (*)    All other components within normal limits  CBC - Abnormal; Notable for the following components:   Hemoglobin 10.1 (*)    HCT 33.5 (*)    MCV 74.8 (*)    MCH 22.5 (*)    All other components within normal limits  URINALYSIS, ROUTINE W REFLEX MICROSCOPIC - Abnormal; Notable for the following components:   APPearance HAZY (*)    Glucose, UA >=500 (*)    Ketones, ur 5 (*)    Leukocytes,Ua SMALL (*)    All other components within normal limits  LIPASE, BLOOD  HCG, SERUM, QUALITATIVE   RPR  HIV ANTIBODY (ROUTINE TESTING W REFLEX)  GC/CHLAMYDIA PROBE AMP ([place]) NOT AT [facility]    EKG EKG Interpretation Date/Time:  Saturday March 30 2023 04:54:41 EDT Ventricular Rate:  84 PR Interval:  144 QRS Duration:  66 QT Interval:  346 QTC Calculation: 408 R Axis:  78  Text Interpretation: Normal sinus rhythm with sinus arrhythmia Septal infarct , age undetermined Abnormal ECG No previous ECGs available Confirmed by Tilden Fossa [phone]) on 03/30/2023 4:58:11 AM  Radiology US Pelvis Complete  Result Date: 03/30/2023 CLINICAL DATA:  Left-sided pelvic pain. EXAM: TRANSABDOMINAL AND TRANSVAGINAL ULTRASOUND OF PELVIS DOPPLER ULTRASOUND OF OVARIES TECHNIQUE: Both transabdominal and transvaginal ultrasound examinations of the pelvis were performed. Transabdominal technique was performed for global imaging of the pelvis including uterus, ovaries, adnexal regions, and pelvic cul-de-sac. It was necessary to proceed with endovaginal exam following the transabdominal exam to visualize the endometrium and ovaries. Color and duplex Doppler ultrasound was utilized to evaluate blood flow to the ovaries. COMPARISON:  CT on 03/30/2023 FINDINGS: Uterus Measurements: 8.9 x 4.6 x 5.9 cm = volume: 127 mL. No fibroids or other mass visualized. Endometrium Thickness: 14 mm.  No focal abnormality visualized. Right ovary Measurements: 4.0 x 2.6 x 2.0 cm = volume: 10.9 mL. Normal appearance/no adnexal mass. Left ovary Measurements: 4.8 x 3.1 x 4.9 cm = volume: 12.7 mL. Simple left ovarian cyst is seen measuring 2.6 cm. A small thick-walled tubular structure is seen adjacent to the left ovary, which shows increased blood flow on color Doppler ultrasound. This is consistent with a mild hydro-pyo-salpinx. Pulsed Doppler evaluation of both ovaries demonstrates normal low-resistance arterial and venous waveforms. Other findings Trace amount of free pelvic fluid noted. IMPRESSION: Mild left hydro-pyo-salpinx. 2.6 cm  simple left ovarian cyst. No sonographic evidence of ovarian torsion. Electronically Signed   By: Danae Orleans M.D.   On: 03/30/2023 11:33   US Transvaginal Non-OB  Result Date: 03/30/2023 CLINICAL DATA:  Left-sided pelvic pain. EXAM: TRANSABDOMINAL AND TRANSVAGINAL ULTRASOUND OF PELVIS DOPPLER ULTRASOUND OF OVARIES TECHNIQUE: Both transabdominal and transvaginal ultrasound examinations of the pelvis were performed. Transabdominal technique was performed for global imaging of the pelvis including uterus, ovaries, adnexal regions, and pelvic cul-de-sac. It was necessary to proceed with endovaginal exam following the transabdominal exam to visualize the endometrium and ovaries. Color and duplex Doppler ultrasound was utilized to evaluate blood flow to the ovaries. COMPARISON:  CT on 03/30/2023 FINDINGS: Uterus Measurements: 8.9 x 4.6 x 5.9 cm = volume: 127 mL. No fibroids or other mass visualized. Endometrium Thickness: 14 mm.  No focal abnormality visualized. Right ovary Measurements: 4.0 x 2.6 x 2.0 cm = volume: 10.9 mL. Normal appearance/no adnexal mass. Left ovary Measurements: 4.8 x 3.1 x 4.9 cm = volume: 12.7 mL. Simple left ovarian cyst is seen measuring 2.6 cm. A small thick-walled tubular structure is seen adjacent to the left ovary, which shows increased blood flow on color Doppler ultrasound. This is consistent with a mild hydro-pyo-salpinx. Pulsed Doppler evaluation of both ovaries demonstrates normal low-resistance arterial and venous waveforms. Other findings Trace amount of free pelvic fluid noted. IMPRESSION: Mild left hydro-pyo-salpinx. 2.6 cm simple left ovarian cyst. No sonographic evidence of ovarian torsion. Electronically Signed   By: Danae Orleans M.D.   On: 03/30/2023 11:33   [place] Art/Ven Flow Abd Pelv Doppler  Result Date: 03/30/2023 CLINICAL DATA:  Left-sided pelvic pain. EXAM: TRANSABDOMINAL AND TRANSVAGINAL ULTRASOUND OF PELVIS DOPPLER ULTRASOUND OF OVARIES TECHNIQUE: Both  transabdominal and transvaginal ultrasound examinations of the pelvis were performed. Transabdominal technique was performed for global imaging of the pelvis including uterus, ovaries, adnexal regions, and pelvic cul-de-sac. It was necessary to proceed with endovaginal exam following the transabdominal exam to visualize the endometrium and ovaries. Color and duplex Doppler ultrasound was utilized to evaluate blood flow  to the ovaries. COMPARISON:  CT on 03/30/2023 FINDINGS: Uterus Measurements: 8.9 x 4.6 x 5.9 cm = volume: 127 mL. No fibroids or other mass visualized. Endometrium Thickness: 14 mm.  No focal abnormality visualized. Right ovary Measurements: 4.0 x 2.6 x 2.0 cm = volume: 10.9 mL. Normal appearance/no adnexal mass. Left ovary Measurements: 4.8 x 3.1 x 4.9 cm = volume: 12.7 mL. Simple left ovarian cyst is seen measuring 2.6 cm. A small thick-walled tubular structure is seen adjacent to the left ovary, which shows increased blood flow on color Doppler ultrasound. This is consistent with a mild hydro-pyo-salpinx. Pulsed Doppler evaluation of both ovaries demonstrates normal low-resistance arterial and venous waveforms. Other findings Trace amount of free pelvic fluid noted. IMPRESSION: Mild left hydro-pyo-salpinx. 2.6 cm simple left ovarian cyst. No sonographic evidence of ovarian torsion. Electronically Signed   By: Danae Orleans M.D.   On: 03/30/2023 11:33   CT ABDOMEN PELVIS W CONTRAST  Result Date: 03/30/2023 CLINICAL DATA:  32 year old female with history of left lower quadrant abdominal pain. EXAM: CT ABDOMEN AND PELVIS WITH CONTRAST TECHNIQUE: Multidetector CT imaging of the abdomen and pelvis was performed using the standard protocol following bolus administration of intravenous contrast. RADIATION DOSE REDUCTION: This exam was performed according to the departmental dose-optimization program which includes automated exposure control, adjustment of the mA and/or kV according to patient size  and/or use of iterative reconstruction technique. CONTRAST:  75mL OMNIPAQUE IOHEXOL 350 MG/ML SOLN COMPARISON:  No priors. FINDINGS: Lower chest: Unremarkable. Hepatobiliary: No suspicious cystic or solid hepatic lesions. No intra or extrahepatic biliary ductal dilatation. Gallbladder is unremarkable in appearance. Pancreas: No pancreatic mass. No pancreatic ductal dilatation. No pancreatic or peripancreatic fluid collections or inflammatory changes. Spleen: Unremarkable. Adrenals/Urinary Tract: Bilateral kidneys and adrenal glands are normal in appearance. No hydroureteronephrosis. Urinary bladder is normal in appearance. Stomach/Bowel: The appearance of the stomach is normal. No pathologic dilatation of small bowel or colon. Normal appendix. Vascular/Lymphatic: Aortic atherosclerosis. No aneurysm or dissection noted in the abdominal or pelvic vasculature. No lymphadenopathy noted in the abdomen or pelvis. Reproductive: Left fallopian tube appears dilated measuring up to 1.4 cm in diameter (coronal image 36 of series 7), with thickened and enhancing walls. Left ovary is slightly prominent, but otherwise unremarkable in appearance. Right ovary and uterus are otherwise unremarkable in appearance. Uterus and ovaries Other: Trace volume of free fluid in the cul-de-sac, likely physiologic in this young female patient. No larger volume of ascites. No pneumoperitoneum. Musculoskeletal: There are no aggressive appearing lytic or blastic lesions noted in the visualized portions of the skeleton. IMPRESSION: 1. Dilated and inflamed left fallopian tube concerning for potential pyosalpinx, with trace volume of free fluid in the cul-de-sac. Clinical correlation for signs and symptoms of pelvic inflammatory disease is recommended. Electronically Signed   By: Trudie Reed M.D.   On: 03/30/2023 09:30    Procedures Procedures    Medications Ordered in ED Medications  cefTRIAXone (ROCEPHIN) 2 g in sodium chloride 0.9 %  100 mL IVPB (has no administration in time range)  ondansetron (ZOFRAN-ODT) disintegrating tablet 4 mg (4 mg Oral Given 03/30/23 0508)  sodium chloride 0.9 % bolus 1,000 mL (0 mLs Intravenous Stopped 03/30/23 0836)  acetaminophen (TYLENOL) tablet 1,000 mg (1,000 mg Oral Given 03/30/23 0739)  iohexol (OMNIPAQUE) 350 MG/ML injection 75 mL (75 mLs Intravenous Contrast Given 03/30/23 0845)    ED Course/ Medical Decision Making/ A&P  Medical Decision Making Amount and/or Complexity of Data Reviewed Labs: ordered. Radiology: ordered.  Risk OTC drugs. Prescription drug management.   Purcell Nails 32 y.o. presented today for lower abdominal pain. Working DDx that I considered at this time includes, but not limited to, gastroenteritis, colitis, small bowel obstruction, appendicitis, cholecystitis, pancreatitis, nephrolithiasis, AAA, UTI, pyelonephritis, ruptured ectopic pregnancy, PID, ovarian torsion.  R/o DDx: gastroenteritis, colitis, small bowel obstruction, appendicitis, cholecystitis, pancreatitis, nephrolithiasis, AAA, UTI, pyelonephritis, ruptured ectopic pregnancy, PID, ovarian torsion: These are considered less likely due to history of present illness and physical exam findings.  Review of prior external notes: 01/26/21 ED  Unique Tests and My Interpretation:  CBC with differential: unremarkable CMP: hyperglycemia 285 Lipase: unremarkable UA:  > 500 Urine Pregnancy: Negative EKG: Rate, rhythm, axis, intervals all examined and without medically relevant abnormality. ST segments without concerns for elevations Wet mount: Clue cells present HIV: Pending GC: Pending RPR: Pending CT Abd/Pelvis with contrast: Signs of pyosalpinx Pelvic US: pyosalpinx  Discussion with Independent Historian:  Boyfriend  Discussion of Management of Tests:  Milas Hock, MD OBGYN  Risk: Medium: prescription drug management  Risk Stratification Score:  None  Plan: On exam patient was in no acute distress stable vitals.  Patient was resting comfortably during the encounter.  Patient was tender to palpation in the left lower quadrant, suprapubic region, right lower quadrant and states she still has her appendix.  Patient does not endorsing any infectious symptoms over the past week but does state that she has been constipated over the past week which could explain her symptoms however due to the tenderness on exam a CT was ordered to further evaluate.  Patient was given fluids and Tylenol for discomfort.  Patient is diabetic and so patient's constipation may be secondary to gastroparesis as her sugars are 285 today which is above baseline for her and so patient will be given fluids to help bring her sugar down.  Patient stable this time.  Patient CT came back significant for possible pyosalpinx.  Ultrasound will be ordered to further evaluate.  I spoke to the patient and patient was agreeable to a pelvic exam and states that she want to be tested for gonorrhea chlamydia, syphilis, HIV, BV/yeast/trichomonas.  Nurses notified to place patient in room so that we may do the pelvic exam.  Patient stable this time.  Pelvic exam was done with a chaperone in the room that was ultimately unremarkable with no cervical motion tenderness.  Ultrasound shows that patient does have pyosalpinx.  I spoke to the OB/GYN on-call and she states patient to be treated 2 g Rocephin in the ER and discharged on Doxy and Flagyl.  Patient also tested positive for BV and so the Flagyl will cover for that as well.  I stressed the importance of following up with a GYN to the patient as this can have devastating consequences if not fully treated.  Patient verbalized understanding of the importance of following up and all of her questions were answered to her satisfaction.  Patient was given return precautions. Patient stable for discharge at this time.  Patient verbalized understanding of  plan.         Final Clinical Impression(s) / ED Diagnoses Final diagnoses:  Pyosalpinx    Rx / DC Orders ED Discharge Orders          Ordered    metroNIDAZOLE (FLAGYL) 500 MG tablet  2 times daily        03/30/23 1232  doxycycline (VIBRAMYCIN) 100 MG capsule  2 times daily        03/30/23 1232              Remi Deter 03/30/23 1235    Gerhard Munch, MD 03/31/23 1245

## 2023-03-30 NOTE — ED Triage Notes (Signed)
C/o lower abd. Pain onset 1 week ago, states this am she was awaken out of her sleep with abd. Cramping c/o n/v. LBM yest am.

## 2023-03-30 NOTE — Discharge Instructions (Signed)
Please pick up the antibiotics I prescribed for you.  Today your CT and ultrasound showed that you have possible water/pus in one of your fallopian tubes possibly causing her pain.  You are treated with 1 dose of IV antibiotics however you will need to take these oral antibiotics as well.  You also tested positive for BV which will be covered by these antibiotics as well.  It is very important you follow-up with one of the 2 women's health clinics I have attached.  If symptoms change or worsen please return to ER.

## 2023-04-01 LAB — GC/CHLAMYDIA PROBE AMP (~~LOC~~) NOT AT ARMC
Chlamydia: NEGATIVE
Comment: NEGATIVE
Comment: NORMAL
Neisseria Gonorrhea: NEGATIVE

## 2023-04-22 ENCOUNTER — Encounter (HOSPITAL_COMMUNITY): Payer: Self-pay | Admitting: Psychiatry

## 2023-04-22 ENCOUNTER — Other Ambulatory Visit: Payer: Self-pay

## 2023-04-22 ENCOUNTER — Ambulatory Visit (HOSPITAL_COMMUNITY)
Admission: EM | Admit: 2023-04-22 | Discharge: 2023-04-22 | Disposition: A | Discharge status: Other Acute Inpatient Hospital | Payer: Medicaid Other | Attending: Psychiatry | Admitting: Psychiatry

## 2023-04-22 ENCOUNTER — Inpatient Hospital Stay (HOSPITAL_COMMUNITY)
Admission: AD | Admit: 2023-04-22 | Discharge: 2023-04-26 | DRG: 885 | Disposition: A | Discharge status: Home / Self Care | Payer: Medicaid Other | Source: Intra-hospital | Attending: Psychiatry | Admitting: Psychiatry

## 2023-04-22 DIAGNOSIS — Z566 Other physical and mental strain related to work: Secondary | ICD-10-CM

## 2023-04-22 DIAGNOSIS — Z87891 Personal history of nicotine dependence: Secondary | ICD-10-CM

## 2023-04-22 DIAGNOSIS — E119 Type 2 diabetes mellitus without complications: Secondary | ICD-10-CM

## 2023-04-22 DIAGNOSIS — I10 Essential (primary) hypertension: Secondary | ICD-10-CM | POA: Diagnosis present

## 2023-04-22 DIAGNOSIS — Z5986 Financial insecurity: Secondary | ICD-10-CM

## 2023-04-22 DIAGNOSIS — F322 Major depressive disorder, single episode, severe without psychotic features: Principal | ICD-10-CM | POA: Diagnosis present

## 2023-04-22 DIAGNOSIS — Z5982 Transportation insecurity: Secondary | ICD-10-CM

## 2023-04-22 DIAGNOSIS — Z8249 Family history of ischemic heart disease and other diseases of the circulatory system: Secondary | ICD-10-CM | POA: Diagnosis not present

## 2023-04-22 DIAGNOSIS — R45851 Suicidal ideations: Secondary | ICD-10-CM | POA: Insufficient documentation

## 2023-04-22 DIAGNOSIS — R4588 Nonsuicidal self-harm: Secondary | ICD-10-CM | POA: Diagnosis present

## 2023-04-22 DIAGNOSIS — Z79899 Other long term (current) drug therapy: Secondary | ICD-10-CM

## 2023-04-22 DIAGNOSIS — Z833 Family history of diabetes mellitus: Secondary | ICD-10-CM | POA: Diagnosis not present

## 2023-04-22 DIAGNOSIS — F1010 Alcohol abuse, uncomplicated: Secondary | ICD-10-CM | POA: Diagnosis present

## 2023-04-22 DIAGNOSIS — F332 Major depressive disorder, recurrent severe without psychotic features: Secondary | ICD-10-CM

## 2023-04-22 DIAGNOSIS — Z5941 Food insecurity: Secondary | ICD-10-CM | POA: Diagnosis not present

## 2023-04-22 DIAGNOSIS — Z91148 Patient's other noncompliance with medication regimen for other reason: Secondary | ICD-10-CM | POA: Diagnosis not present

## 2023-04-22 DIAGNOSIS — F1290 Cannabis use, unspecified, uncomplicated: Secondary | ICD-10-CM | POA: Insufficient documentation

## 2023-04-22 DIAGNOSIS — R45850 Homicidal ideations: Secondary | ICD-10-CM | POA: Insufficient documentation

## 2023-04-22 DIAGNOSIS — Z7984 Long term (current) use of oral hypoglycemic drugs: Secondary | ICD-10-CM | POA: Diagnosis not present

## 2023-04-22 DIAGNOSIS — F419 Anxiety disorder, unspecified: Secondary | ICD-10-CM | POA: Diagnosis present

## 2023-04-22 DIAGNOSIS — Z560 Unemployment, unspecified: Secondary | ICD-10-CM

## 2023-04-22 DIAGNOSIS — F1210 Cannabis abuse, uncomplicated: Secondary | ICD-10-CM | POA: Diagnosis present

## 2023-04-22 DIAGNOSIS — F1090 Alcohol use, unspecified, uncomplicated: Secondary | ICD-10-CM | POA: Insufficient documentation

## 2023-04-22 DIAGNOSIS — Z91040 Latex allergy status: Secondary | ICD-10-CM | POA: Diagnosis not present

## 2023-04-22 LAB — CBC WITH DIFFERENTIAL/PLATELET
Abs Immature Granulocytes: 0.01 10*3/uL (ref 0.00–0.07)
Basophils Absolute: 0 10*3/uL (ref 0.0–0.1)
Basophils Relative: 0 %
Eosinophils Absolute: 0.1 10*3/uL (ref 0.0–0.5)
Eosinophils Relative: 1 %
HCT: 36.9 % (ref 36.0–46.0)
Hemoglobin: 10.9 g/dL — ABNORMAL LOW (ref 12.0–15.0)
Immature Granulocytes: 0 %
Lymphocytes Relative: 48 %
Lymphs Abs: 2.8 10*3/uL (ref 0.7–4.0)
MCH: 22 pg — ABNORMAL LOW (ref 26.0–34.0)
MCHC: 29.5 g/dL — ABNORMAL LOW (ref 30.0–36.0)
MCV: 74.4 fL — ABNORMAL LOW (ref 80.0–100.0)
Monocytes Absolute: 0.3 10*3/uL (ref 0.1–1.0)
Monocytes Relative: 6 %
Neutro Abs: 2.6 10*3/uL (ref 1.7–7.7)
Neutrophils Relative %: 45 %
Platelets: 450 10*3/uL — ABNORMAL HIGH (ref 150–400)
RBC: 4.96 MIL/uL (ref 3.87–5.11)
RDW: 14.5 % (ref 11.5–15.5)
WBC: 5.9 10*3/uL (ref 4.0–10.5)
nRBC: 0 % (ref 0.0–0.2)

## 2023-04-22 LAB — URINALYSIS, COMPLETE (UACMP) WITH MICROSCOPIC
Bilirubin Urine: NEGATIVE
Glucose, UA: >=500 mg/dL — AB
Ketones, ur: 5 mg/dL — AB
Leukocytes,Ua: NEGATIVE
Nitrite: NEGATIVE
Protein, ur: NEGATIVE mg/dL
Specific Gravity, Urine: 1.03 (ref 1.005–1.030)
pH: 5 (ref 5.0–8.0)

## 2023-04-22 LAB — LIPID PANEL
Cholesterol: 276 mg/dL — ABNORMAL HIGH (ref 0–200)
HDL: 48 mg/dL (ref >40)
LDL Cholesterol: 207 mg/dL — ABNORMAL HIGH (ref 0–99)
Total CHOL/HDL Ratio: 5.8 RATIO
Triglycerides: 103 mg/dL (ref <150)
VLDL: 21 mg/dL (ref 0–40)

## 2023-04-22 LAB — COMPREHENSIVE METABOLIC PANEL
ALT: 19 U/L (ref 0–44)
AST: 19 U/L (ref 15–41)
Albumin: 4.2 g/dL (ref 3.5–5.0)
Alkaline Phosphatase: 46 U/L (ref 38–126)
Anion gap: 11 (ref 5–15)
BUN: 6 mg/dL (ref 6–20)
CO2: 25 mmol/L (ref 22–32)
Calcium: 9.7 mg/dL (ref 8.9–10.3)
Chloride: 99 mmol/L (ref 98–111)
Creatinine, Ser: 0.71 mg/dL (ref 0.44–1.00)
GFR, Estimated: >60 mL/min (ref >60)
Glucose, Bld: 258 mg/dL — ABNORMAL HIGH (ref 70–99)
Potassium: 3.6 mmol/L (ref 3.5–5.1)
Sodium: 135 mmol/L (ref 135–145)
Total Bilirubin: 0.8 mg/dL (ref 0.3–1.2)
Total Protein: 8.2 g/dL — ABNORMAL HIGH (ref 6.5–8.1)

## 2023-04-22 LAB — POCT URINE DRUG SCREEN - MANUAL ENTRY (I-SCREEN)
POC Amphetamine UR: NOT DETECTED
POC Buprenorphine (BUP): NOT DETECTED
POC Cocaine UR: NOT DETECTED
POC Marijuana UR: POSITIVE — AB
POC Methadone UR: NOT DETECTED
POC Methamphetamine UR: NOT DETECTED
POC Morphine: NOT DETECTED
POC Oxazepam (BZO): NOT DETECTED
POC Oxycodone UR: NOT DETECTED
POC Secobarbital (BAR): NOT DETECTED

## 2023-04-22 LAB — POCT PREGNANCY, URINE: Preg Test, Ur: NEGATIVE

## 2023-04-22 LAB — POC URINE PREG, ED: Preg Test, Ur: NEGATIVE

## 2023-04-22 LAB — HEMOGLOBIN A1C
Hgb A1c MFr Bld: 10.4 % — ABNORMAL HIGH (ref 4.8–5.6)
Mean Plasma Glucose: 251.78 mg/dL

## 2023-04-22 LAB — GLUCOSE, CAPILLARY: Glucose-Capillary: 229 mg/dL — ABNORMAL HIGH (ref 70–99)

## 2023-04-22 LAB — ETHANOL: Alcohol, Ethyl (B): <10 mg/dL (ref <10)

## 2023-04-22 LAB — MAGNESIUM: Magnesium: 1.6 mg/dL — ABNORMAL LOW (ref 1.7–2.4)

## 2023-04-22 LAB — TSH: TSH: 0.956 u[IU]/mL (ref 0.350–4.500)

## 2023-04-22 MED ORDER — TRAZODONE HCL 50 MG PO TABS: 50.0000 mg | ORAL_TABLET | Freq: Every evening | ORAL | Status: DC | PRN

## 2023-04-22 MED ORDER — ACETAMINOPHEN 325 MG PO TABS: 650.0000 mg | ORAL_TABLET | Freq: Four times a day (QID) | ORAL | Status: DC | PRN

## 2023-04-22 MED ORDER — HALOPERIDOL 5 MG PO TABS: 5.0000 mg | ORAL_TABLET | Freq: Three times a day (TID) | ORAL | Status: DC | PRN

## 2023-04-22 MED ORDER — AMLODIPINE BESYLATE 5 MG PO TABS
5.0000 mg | ORAL_TABLET | Freq: Every day | ORAL | Status: DC
  Administered 2023-04-22: 5 mg via ORAL
  Filled 2023-04-22: qty 1

## 2023-04-22 MED ORDER — MAGNESIUM OXIDE -MG SUPPLEMENT 400 (240 MG) MG PO TABS
400.0000 mg | ORAL_TABLET | Freq: Two times a day (BID) | ORAL | Status: AC
  Administered 2023-04-22 – 2023-04-23 (×2): 400 mg via ORAL
  Filled 2023-04-22 (×3): qty 1

## 2023-04-22 MED ORDER — AMLODIPINE BESYLATE 5 MG PO TABS
5.0000 mg | ORAL_TABLET | Freq: Every day | ORAL | Status: DC
  Administered 2023-04-22 – 2023-04-26 (×5): 5 mg via ORAL
  Filled 2023-04-22 (×8): qty 1

## 2023-04-22 MED ORDER — METFORMIN HCL ER 500 MG PO TB24
500.0000 mg | ORAL_TABLET | Freq: Two times a day (BID) | ORAL | Status: DC
  Administered 2023-04-23 – 2023-04-26 (×7): 500 mg via ORAL
  Filled 2023-04-22 (×12): qty 1

## 2023-04-22 MED ORDER — MAGNESIUM HYDROXIDE 400 MG/5ML PO SUSP: 30.0000 mL | Freq: Every day | ORAL | Status: DC | PRN

## 2023-04-22 MED ORDER — AMLODIPINE BESYLATE 5 MG PO TABS: 5.0000 mg | ORAL_TABLET | Freq: Every day | ORAL | Status: DC

## 2023-04-22 MED ORDER — DIPHENHYDRAMINE HCL 25 MG PO CAPS: 50.0000 mg | ORAL_CAPSULE | Freq: Three times a day (TID) | ORAL | Status: DC | PRN

## 2023-04-22 MED ORDER — METFORMIN HCL ER 500 MG PO TB24: 500.0000 mg | ORAL_TABLET | Freq: Two times a day (BID) | ORAL | Status: DC

## 2023-04-22 MED ORDER — ALUM & MAG HYDROXIDE-SIMETH 200-200-20 MG/5ML PO SUSP: 30.0000 mL | ORAL | Status: DC | PRN

## 2023-04-22 MED ORDER — SERTRALINE HCL 25 MG PO TABS
25.0000 mg | ORAL_TABLET | Freq: Every day | ORAL | Status: DC
  Administered 2023-04-22: 25 mg via ORAL
  Filled 2023-04-22: qty 1

## 2023-04-22 MED ORDER — LORAZEPAM 1 MG PO TABS: 2.0000 mg | ORAL_TABLET | Freq: Three times a day (TID) | ORAL | Status: DC | PRN

## 2023-04-22 MED ORDER — HYDROXYZINE HCL 25 MG PO TABS
25.0000 mg | ORAL_TABLET | Freq: Three times a day (TID) | ORAL | Status: DC | PRN
  Administered 2023-04-22 – 2023-04-24 (×3): 25 mg via ORAL
  Filled 2023-04-22 (×3): qty 1

## 2023-04-22 MED ORDER — AMLODIPINE BESYLATE 5 MG PO TABS
5.0000 mg | ORAL_TABLET | Freq: Every day | ORAL | Status: DC
  Filled 2023-04-22: qty 1

## 2023-04-22 MED ORDER — SERTRALINE HCL 25 MG PO TABS: 25.0000 mg | ORAL_TABLET | Freq: Every day | ORAL | Status: DC

## 2023-04-22 MED ORDER — SERTRALINE HCL 25 MG PO TABS
25.0000 mg | ORAL_TABLET | Freq: Every day | ORAL | Status: DC
  Administered 2023-04-23: 25 mg via ORAL
  Filled 2023-04-22 (×3): qty 1

## 2023-04-22 MED ORDER — DIPHENHYDRAMINE HCL 50 MG/ML IJ SOLN: 50.0000 mg | Freq: Three times a day (TID) | INTRAMUSCULAR | Status: DC | PRN

## 2023-04-22 MED ORDER — HALOPERIDOL LACTATE 5 MG/ML IJ SOLN: 5.0000 mg | Freq: Three times a day (TID) | INTRAMUSCULAR | Status: DC | PRN

## 2023-04-22 MED ORDER — LORAZEPAM 2 MG/ML IJ SOLN: 2.0000 mg | Freq: Three times a day (TID) | INTRAMUSCULAR | Status: DC | PRN

## 2023-04-22 MED ORDER — TRAZODONE HCL 50 MG PO TABS
50.0000 mg | ORAL_TABLET | Freq: Every evening | ORAL | Status: DC | PRN
  Administered 2023-04-22 – 2023-04-24 (×3): 50 mg via ORAL
  Filled 2023-04-22 (×3): qty 1

## 2023-04-22 MED ORDER — METFORMIN HCL ER 500 MG PO TB24
500.0000 mg | ORAL_TABLET | Freq: Two times a day (BID) | ORAL | Status: DC
  Administered 2023-04-22: 500 mg via ORAL
  Filled 2023-04-22: qty 1
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
## 2023-04-22 NOTE — Progress Notes (Signed)
Pt has been accepted to [facility] TODAY 04/22/2023 pending UDS, EKG, and lowered BP. Bed assignment: 304-1  Pt meets inpatient criteria per Vernard Gambles, NP  Attending Physician will be Phineas Inches, MD  Report can be called to: - Adult unit: [phone]  Pt can arrive after pending items are received  Care Team Notified: [facility] Rona Ravens, RN, Vernard Gambles, NP, Roseanne Reno, RN, Tyrell Antonio, RN, and Florentina Addison, RN  [place], [place]  04/22/2023 11:26 AM

## 2023-04-22 NOTE — Progress Notes (Signed)
Pt did not attend AA group  

## 2023-04-22 NOTE — Progress Notes (Signed)
   04/22/23 2000  Psych Admission Type (Psych Patients Only)  Admission Status Voluntary  Psychosocial Assessment  Patient Complaints Agitation;Anger;Anxiety;Appetite decrease;Decreased concentration;Confusion;Crying spells;Depression;Hopelessness;Irritability;Insomnia;Loneliness;Nervousness;Panic attack;Restlessness;Sadness;Self-harm thoughts;Tension;Worrying  Eye Contact Fair  Facial Expression Sad;Worried;Trembling lip  Affect Depressed;Sad  Speech Logical/coherent  Interaction Assertive  Motor Activity Other (Comment) (WDL)  Appearance/Hygiene Unremarkable  Behavior Characteristics Cooperative;Appropriate to situation  Mood Depressed;Sad  Thought Process  Coherency WDL  Content WDL  Delusions None reported or observed  Perception WDL  Hallucination None reported or observed  Judgment WDL  Confusion None  Danger to Self  Current suicidal ideation? Denies (Denies)  Description of Suicide Plan patient reports, "sat in car Friday or Saturday with windows up"  Agreement Not to Harm Self Yes  Description of Agreement verbal  Danger to Others  Danger to Others Reported or observed  Danger to Others Abnormal  Harmful Behavior to others Threats of violence towards other people observed or expressed   Description of Harmful Behavior patient reported HI and stated, "I'd rather not say"

## 2023-04-22 NOTE — ED Notes (Signed)
Patient admitted to obs unit. Patient A&Ox4. Independent with ADLS. Steady gait. Appropriate to situation although states feeling depressed and wants to do better for her 33 year old son. Patient denies current SI,HI, and A/V/H with no plan or intent but states she has been feeling depressed for a while and stopped attending counseling after 1 session and stopped taking her medication for depression and hypertension. Patient states she had missed 1 week of her due to her mental health and when she went back to work "they acted like they didn't want me anymore." Patient states this situation has her stressed. Patient also states she lost her Grandma and did not grieve appropriately. She stated her grandma lived with her mother and her and was her support system. Patient stated she took care of her and when she passed away she just has not been the same and feels like her main support system is gone. Patient provided with emotional support. Oriented to unit. Patient states she lives with her mother (who works for [employer]) and plans to return with back with mother after discharge. No s/s of current distress. Patient remains pleasant and safe on unit.

## 2023-04-22 NOTE — ED Notes (Signed)
Patient is transferring to [facility] at this time via safe transport. Voluntary consent, EMTALA, and other transfer paperwork sent with patient and provided to transport. Patient A&OX4. Denies SI,HI, and A/V/H. Calm and cooperative. No valuables/belongings. Patient in no current distress.

## 2023-04-22 NOTE — Progress Notes (Signed)
   04/22/23 1022  [facility] Triage Screening (Walk-ins at [facility] only)  How Did You Hear About [place]? Family/Friend  What Is the Reason for Your Visit/Call Today? Pt arrived to [facility] accompanied by her mother. Pt states that she is having thoughts of wanting to hurt herself for the past few days. Pt states she shared with her mother that she needs some type help because she has a child. She hasnt been diagnosed with any mental illneses. Pt states is SI & HI, but denies AVH. Pt stated she smoke 1 blunt within the last 24 hours. Pt states that she doesn't feel safe to return home today.  How Long Has This Been Causing You Problems? 1-6 months  Have You Recently Had Any Thoughts About Hurting Yourself? Yes  How long ago did you have thoughts about hurting yourself? last few days  Are You Planning to Commit Suicide/Harm Yourself At This time? Yes  Have you Recently Had Thoughts About Hurting Someone Karolee Ohs? Yes  How long ago did you have thoughts of harming others? off an on for the last month  Are You Planning To Harm Someone At This Time? Yes  Explanation: no particular person  Are you currently experiencing any auditory, visual or other hallucinations? No  Have You Used Any Alcohol or Drugs in the Past 24 Hours? Yes  How long ago did you use Drugs or Alcohol? marijuana  What Did You Use and How Much? smoked 1 blunt  Do you have any current medical co-morbidities that require immediate attention? Yes  Please describe current medical co-morbidities that require immediate attention: diabetes, hypertension  Clinician description of patient physical appearance/behavior: casually dressed, tearful, cooperative  Determination of Need Emergent (2 hours)  Options For Referral Inpatient Hospitalization;Intensive Outpatient Therapy;Medication Management;BH Urgent Care

## 2023-04-22 NOTE — Discharge Instructions (Signed)
Transfer patient to [facility] for inpatient psychiatric admission.  Dr. Florene Route is the accepting MD

## 2023-04-22 NOTE — BH Assessment (Signed)
Comprehensive Clinical Assessment (CCA) Note  04/22/2023 Megan Ibarra [id number]  Disposition: Per Vernard Gambles, NP inpatient treatment is recommended.  [facility] to review.  Disposition SW to pursue appropriate inpatient options.  The patient demonstrates the following risk factors for suicide: Chronic risk factors for suicide include: substance use disorder. Acute risk factors for suicide include: social withdrawal/isolation and loss (financial, interpersonal, professional). Protective factors for this patient include: positive social support, responsibility to others (children, family), and hope for the future. Considering these factors, the overall suicide risk at this point appears to be moderate. Patient is appropriate for outpatient follow up, once stabilized.   Patient is a 32 year old female with no past psychiatric history who presents voluntarily, accompanied by her mother, to [facility] for assessment.  Patient reports worsening depression and recent onset of SI several days ago.  She reports feeling she has been getting angry easily and "lashing out" at times.  Patient reports that on Saturday, when she began to feel overwhelmed, she sat in her car parked at the house, "in the heat with windows up.  I didn't care what happened to me."  Patient reports there are several stressors, including having issues at work with co-workers, along with relationship issues with her boyfriend.  She states he has mental health issues and is now in treatment.  Patient was working as a Armed forces training and education officer, however some staff made complaints about "things I missed.  I am not on the schedule at this point."  She is uncertain as to her employment status.  Patient states she had a "break down" yesterday while with her boyfriend.  She reports feeling hopeless and she began considering a suicide plan of jumping from a bridge.  She has no history of attempts.  Patient initially  endorsed HI, however describes symptoms as "getting angry and lashing out at people."  She denies homicidal ideation, plan or intent.  Patient denies AVH.  She reports hx of THC. She uses throughout the day every day.  Patient is unable to affirm her safety at this time.  Treatment options were discussed.  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 Family/Friend  What Is the Reason for Your Visit/Call Today? Pt arrived to [facility] accompanied by her mother. Pt states that she is having thoughts of wanting to hurt herself for the past few days. Pt states she shared with her mother that she needs some type help because she has a child. She hasnt been diagnosed with any mental illneses. Pt states is SI & HI, but denies AVH. Pt stated she smoke 1 blunt within the last 24 hours. Pt states that she doesn't feel safe to return home today.  How Long Has This Been Causing You Problems? 1-6 months  What Do You Feel Would Help You the Most Today? No data recorded  Have You Recently Had Any Thoughts About Hurting Yourself? Yes  Are You Planning to Commit Suicide/Harm Yourself At This time? Yes   Flowsheet Row ED from 04/22/2023 in [facility] ED from 03/30/2023 in [facility] Emergency Department at [facility] ED from 01/26/2021  in [facility] at [facility] RISK CATEGORY High Risk No Risk No Risk       Have you Recently Had Thoughts About Hurting Someone Karolee Ohs? Yes  Are You Planning to Harm Someone at This Time? Yes  Explanation: no particular person   Have You Used Any Alcohol or Drugs in the Past 24 Hours?  Yes  What Did You Use and How Much? smoked 1 blunt   Do You Currently Have a Therapist/Psychiatrist? No  Name of Therapist/Psychiatrist: Name of Therapist/Psychiatrist: N/A   Have You Been Recently Discharged From Any Office Practice or Programs? No  Explanation of Discharge From Practice/Program: N/A     CCA Screening Triage Referral Assessment Type of Contact: Face-to-Face  Telemedicine Service Delivery:   Is this Initial or Reassessment?   Date Telepsych consult ordered in [facility]:    Time Telepsych consult ordered in [facility]:    Location of Assessment: [facility]  Provider Location: [facility]   Collateral Involvement: Mother is present and is supportive.   Does Patient Have a Automotive engineer Guardian? No  Legal Guardian Contact Information: N/A  Copy of Legal Guardianship Form: -- (N/A)  Legal Guardian Notified of Arrival: -- (N/A)  Legal Guardian Notified of Pending Discharge: -- (N/A)  If Minor and Not Living with Parent(s), Who has Custody? N/A  Is CPS involved or ever been involved? Never  Is APS involved or ever been involved? Never   Patient Determined To Be At Risk for Harm To Self or Others Based on Review of Patient Reported Information or Presenting Complaint? Yes, for Self-Harm  Method: -- (N/A, no HI)  Availability of Means: -- (N/A, no HI)  Intent: -- (N/A, no HI)  Notification Required: -- (N/A, no HI)  Additional Information for Danger to Others Potential: -- (N/A, no HI)  Additional Comments for Danger to Others Potential: N/A, no HI  Are There Guns or Other Weapons in Your Home? No  Types of Guns/Weapons: N/A  Are These Weapons Safely Secured?                            -- (N/A)  Who Could Verify You Are Able To Have These Secured: N/A  Do You Have any Outstanding Charges, Pending Court Dates, Parole/Probation? N/A  Contacted To Inform of Risk of Harm To Self or Others: Family/Significant  Other:    Does Patient Present under Involuntary Commitment? No    [place] of Residence: [place]   Patient Currently Receiving the Following Services: Not Receiving Services   Determination of Need: Emergent (2 hours)   Options For Referral: Inpatient Hospitalization     CCA Biopsychosocial Patient Reported Schizophrenia/Schizoaffective Diagnosis in Past: No   Strengths: Patient is seeking treatment, she has family support   Mental Health Symptoms Depression:   Difficulty Concentrating; Hopelessness; Increase/decrease in appetite; Sleep (too much or little); Tearfulness; Worthlessness   Duration of Depressive symptoms:  Duration of Depressive Symptoms: Greater than two weeks   Mania:   None   Anxiety:    Worrying; Tension   Psychosis:   None   Duration of Psychotic symptoms:    Trauma:   None   Obsessions:   None   Compulsions:   None   Inattention:   N/A   Hyperactivity/Impulsivity:   N/A   Oppositional/Defiant Behaviors:   N/A   Emotional Irregularity:   Chronic feelings of emptiness  Other Mood/Personality Symptoms:   NA    Mental Status Exam Appearance and self-care  Stature:   Average   Weight:   Overweight   Clothing:   Casual   Grooming:   Normal   Cosmetic use:   Age appropriate   Posture/gait:   Normal   Motor activity:   Not Remarkable   Sensorium  Attention:   Normal   Concentration:   Preoccupied; Variable   Orientation:   X5   Recall/memory:   Normal   Affect and Mood  Affect:   Depressed; Flat   Mood:   Depressed   Relating  Eye contact:   Normal   Facial expression:   Depressed; Responsive   Attitude toward examiner:   Cooperative   Thought and Language  Speech flow:  Clear and Coherent   Thought content:   Appropriate to Mood and Circumstances   Preoccupation:   None   Hallucinations:   None   Organization:   Intact   [employer] of Knowledge:    Average   Intelligence:   Average   Abstraction:   Normal   Judgement:   Impaired   Reality Testing:   Adequate   Insight:   Gaps   Decision Making:   Normal; Vacilates   Social Functioning  Social Maturity:   Impulsive   Social Judgement:   Normal   Stress  Stressors:   Relationship; Financial; Work   Coping Ability:   Exhausted   Skill Deficits:   Communication; Interpersonal; Self-control   Supports:   Family; Friends/Service system     Religion: Religion/Spirituality Are You A Religious Person?: No How Might This Affect Treatment?: N/A  Leisure/Recreation: Leisure / Recreation Do You Have Hobbies?: No  Exercise/Diet: Exercise/Diet Do You Exercise?: No Have You Gained or Lost A Significant Amount of Weight in the Past Six Months?: No Do You Follow a Special Diet?: No Do You Have Any Trouble Sleeping?: Yes Explanation of Sleeping Difficulties: Patient states she has only been sleeping 5 hours per night.  She wakes up and can't get back to sleep.   CCA Employment/Education Employment/Work Situation: Employment / Work Systems developer:  (Patient uncertain of status.) Patient's Job has Been Impacted by Current Illness: Yes Describe how Patient's Job has Been Impacted: Patient has had a few issues with employees.  She has been a Engineer, materials at [employer].  She states she isn't on the schedule, "all of a sudden" however she is uncertain as to whether she is still employed. Has Patient ever Been in the [employer]?: No  Education: Education Is Patient Currently Attending School?: No Last Grade Completed: 14 Did You Attend College?: Yes What Type of College Degree Do you Have?: Certificate/associates - Early Chidhood Education Did You Have An Individualized Education Program (IIEP): No Did You Have Any Difficulty At School?: No Patient's Education Has Been Impacted by Current Illness: No   CCA Family/Childhood  History Family and Relationship History: Family history Marital status: Single Does patient have children?: Yes How many children?: 1 How is patient's relationship with their children?: 5 y.o. son lives with patient and patient's parents.  No concerns reported.  Childhood History:  Childhood History By whom was/is the patient raised?: Both parents Did patient suffer any verbal/emotional/physical/sexual abuse as a child?: No Did patient suffer from severe childhood neglect?: No Has patient ever been sexually abused/assaulted/raped as an adolescent or adult?: No Witnessed domestic violence?: No Has patient been affected by domestic violence  as an adult?: No       CCA Substance Use Alcohol/Drug Use: Alcohol / Drug Use Pain Medications: See MAR Prescriptions: See MAR Over the Counter: See MAR History of alcohol / drug use?: Yes Longest period of sobriety (when/how long): N/A Substance #1 Name of Substance 1: THC 1 - Age of First Use: 20s 1 - Amount (size/oz): varies - " I smoke through the day." 1 - Frequency: daily 1 - Duration: "years" 1 - Last Use / Amount: today - amt unknown 1 - Method of Aquiring: NA 1- Route of Use: NA                       ASAM's:  Six Dimensions of Multidimensional Assessment  Dimension 1:  Acute Intoxication and/or Withdrawal Potential:   Dimension 1:  Description of individual's past and current experiences of substance use and withdrawal: N/A  Dimension 2:  Biomedical Conditions and Complications:   Dimension 2:  Description of patient's biomedical conditions and  complications: N/A  Dimension 3:  Emotional, Behavioral, or Cognitive Conditions and Complications:  Dimension 3:  Description of emotional, behavioral, or cognitive conditions and complications: N/A  Dimension 4:  Readiness to Change:  Dimension 4:  Description of Readiness to Change criteria: N/A  Dimension 5:  Relapse, Continued use, or Continued Problem Potential:   Dimension 5:  Relapse, continued use, or continued problem potential critiera description: N/A  Dimension 6:  Recovery/Living Environment:  Dimension 6:  Recovery/Iiving environment criteria description: N/A  ASAM Severity Score:    ASAM Recommended Level of Treatment: ASAM Recommended Level of Treatment: Level I Outpatient Treatment   Substance use Disorder (SUD)    Recommendations for Services/Supports/Treatments:    Discharge Disposition:    DSM5 Diagnoses: Patient Active Problem List   Diagnosis Date Noted   SVD (spontaneous vaginal delivery) 06/08/2017   Laceration of vaginal wall or sulcus without perineal laceration during delivery 06/08/2017   Chronic hypertension in pregnancy 06/08/2017   Normal labor 06/07/2017   Uncontrolled diabetes mellitus 11/30/2016     Referrals to Alternative Service(s): Referred to Alternative Service(s):   Place:   Date:   Time:    Referred to Alternative Service(s):   Place:   Date:   Time:    Referred to Alternative Service(s):   Place:   Date:   Time:    Referred to Alternative Service(s):   Place:   Date:   Time:     Yetta Glassman, [facility]

## 2023-04-23 DIAGNOSIS — F322 Major depressive disorder, single episode, severe without psychotic features: Secondary | ICD-10-CM

## 2023-04-23 DIAGNOSIS — I10 Essential (primary) hypertension: Secondary | ICD-10-CM | POA: Insufficient documentation

## 2023-04-23 LAB — GLUCOSE, CAPILLARY
Glucose-Capillary: 285 mg/dL — ABNORMAL HIGH (ref 70–99)
Glucose-Capillary: 187 mg/dL — ABNORMAL HIGH (ref 70–99)
Glucose-Capillary: 221 mg/dL — ABNORMAL HIGH (ref 70–99)

## 2023-04-23 MED ORDER — ENSURE ENLIVE PO LIQD
237.0000 mL | Freq: Two times a day (BID) | ORAL | Status: DC
  Filled 2023-04-23 (×11): qty 237

## 2023-04-23 MED ORDER — SERTRALINE HCL 50 MG PO TABS
50.0000 mg | ORAL_TABLET | Freq: Every day | ORAL | Status: DC
  Administered 2023-04-24: 50 mg via ORAL
  Filled 2023-04-23 (×4): qty 1

## 2023-04-23 MED ORDER — CLONIDINE HCL 0.1 MG PO TABS
0.1000 mg | ORAL_TABLET | Freq: Four times a day (QID) | ORAL | Status: DC | PRN
  Administered 2023-04-23: 0.1 mg via ORAL

## 2023-04-23 MED ORDER — INSULIN ASPART 100 UNIT/ML IJ SOLN
0.0000 [IU] | Freq: Three times a day (TID) | INTRAMUSCULAR | Status: DC
  Administered 2023-04-23: 11 [IU] via SUBCUTANEOUS
  Administered 2023-04-23: 4 [IU] via SUBCUTANEOUS
  Administered 2023-04-24: 7 [IU] via SUBCUTANEOUS
  Administered 2023-04-24: 4 [IU] via SUBCUTANEOUS
  Administered 2023-04-24: 7 [IU] via SUBCUTANEOUS
  Administered 2023-04-25: 4 [IU] via SUBCUTANEOUS
  Administered 2023-04-25: 11 [IU] via SUBCUTANEOUS
  Administered 2023-04-25: 4 [IU] via SUBCUTANEOUS
  Administered 2023-04-26: 3 [IU] via SUBCUTANEOUS
  Administered 2023-04-26: 7 [IU] via SUBCUTANEOUS

## 2023-04-23 NOTE — [facility] Suicide Risk Assessment (Signed)
[facility] INPATIENT:  Family/Significant Other Suicide Prevention Education  Suicide Prevention Education:  Education Completed; Marcelle Smiling Smith,mother [phone]  (name of family member/significant other) has been identified by the patient as the family member/significant other with whom the patient will be residing, and identified as the person(s) who will aid the patient in the event of a mental health crisis (suicidal ideations/suicide attempt).  With written consent from the patient, the family member/significant other has been provided the following suicide prevention education, prior to the and/or following the discharge of the patient. Collateral information received from pt's mother, Marcelle Smiling who reported no safety concerns with pt returning home. Pt's mother reported no firearms in the home. Marcelle Smiling reported she will lock away all knives and sharp objects. Marcelle Smiling reported that she will administer all pt's medications. Marcelle Smiling reported that she will go thru pt's room to make sure it is safe. Pt's mother reported pt is asking to be discharged, mother feels pt is not ready. Marcelle Smiling reported pt smokes weed daily and feels this is the reason pt is requesting to leave. Pt can return home once discharged. The suicide prevention education provided includes the following: Suicide risk factors Suicide prevention and interventions National Suicide Hotline telephone number [facility] assessment telephone number [facility] Emergency Assistance 911 [facility] and/or Residential Mobile Crisis Unit telephone number  Request made of family/significant other to: Remove weapons (e.g., guns, rifles, knives), all items previously/currently identified as safety concern.   Remove drugs/medications (over-the-counter, prescriptions, illicit drugs), all items previously/currently identified as a safety concern.  The family member/significant other verbalizes understanding of the suicide prevention  education information provided.  The family member/significant other agrees to remove the items of safety concern listed above.  Neela Zecca, Candace Cruise 04/23/2023, 6:59 PM

## 2023-04-23 NOTE — H&P (Signed)
Psychiatric Admission Assessment Adult  Patient Identification: Megan Ibarra MRN:  [id number] Date of Evaluation:  04/23/2023  Chief Complaint:  MDD (major depressive disorder), severe (HCC) [F32.2]  History of Present Illness:  Megan Ibarra is a 32 y.o., female with a past psychiatric history significant for anxiety disorder who presents to the [facility] from behavioral health urgent care for evaluation and management of worsening depression and SI.  According to outside records, the patient reported herself to urgent care for increased depression over the past few months after stopping the medications for depression as well as her medical medications for hypertension and diabetes  Initial assessment on 04/23/2023, patient was evaluated on the inpatient unit, the patient reports she has been depressed on and off for few years since her grandmother passed away but worsening depression since October 2023 after lost her job back exam, she reports more worsening of her depression over the past few months secondary to increased stress related to work stressors as well as financial stressors taking care of her 64 years old son.  She reports she was diagnosed with anxiety by her primary care provider and was given medications for but few months ago she stopped all medications for mood as well as for hypertension and diabetes she admits to feeling careless if she would die as well as passive SI wishing self dead on and off.  She reports prior to admission had passive SI last week and and on Friday had active SI to kill herself but did not have any specific plan then started reaching out for help. She denies any current passive or active SI intention or plan and able to contract for safety in the hospital.  She reports poor sleep and decreased appetite prior to admission as well as decreased energy and interest as well as decreased motivation and concentration.  She reports worsening  feeling hopeless helpless and worthless over the past few months as well.  She denies symptoms consistent with mania or hypomania or PTSD.  She denies any HI or AVH or paranoia or other delusions.  Chart review: Notes from urgent care were reviewed, no previous psychiatric hospitalizations noted per chart review  Psych meds prior to admission:  None  Sleep Sleep:Sleep: Poor Number of Hours of Sleep: 5   Collateral information: None at this time  Past Psychiatric History:  Prior Psychiatric diagnoses: Previous diagnoses of anxiety by primary care provider Past Psychiatric Hospitalizations: Denies  History of self mutilation: Denies Past suicide attempts: Denies Past history of HI, violent or aggressive behavior: Denies  Past Psychiatric medications trials: Tried some medication for anxiety by primary care provider few months ago but unable to recall details History of ECT/TMS: Denies  Outpatient psychiatric Follow up: None Prior Outpatient Therapy: Had 1 session for counseling earlier in July but never followed up    Is the patient at risk to self? Yes.    Has the patient been a risk to self in the past 6 months? No.  Has the patient been a risk to self within the distant past? No.  Is the patient a risk to others? No.  Has the patient been a risk to others in the past 6 months? No.  Has the patient been a risk to others within the distant past? No.    Substance Use History: Alcohol: Reports history of drinking alcohol on daily basis about 4 years ago but denies any withdrawals or DTs by exam.  Recently admits to drinking socially  about once every other month without any report of withdrawals or blackouts Tobacoo: Denies Marijuana: Admits to marijuana use on daily basis since age 83 years old "to relax" Cocaine: Denies Stimulants: Denies IV drug use: Denies Opiates: Denies Prescribed Meds abuse: Denies H/O withdrawals, blackouts, DTs: Denies except for reporting history  of blackouts when he was drinking alcohol on daily basis until 4 years ago History of Detox / Rehab: Denies DUI: Denies  Alcohol Screening: 1. How often do you have a drink containing alcohol?: Monthly or less 2. How many drinks containing alcohol do you have on a typical day when you are drinking?: 1 or 2 3. How often do you have six or more drinks on one occasion?: Never AUDIT-C Score: 1 4. How often during the last year have you found that you were not able to stop drinking once you had started?: Never 5. How often during the last year have you failed to do what was normally expected from you because of drinking?: Never 6. How often during the last year have you needed a first drink in the morning to get yourself going after a heavy drinking session?: Never 7. How often during the last year have you had a feeling of guilt of remorse after drinking?: Never 8. How often during the last year have you been unable to remember what happened the night before because you had been drinking?: Never 9. Have you or someone else been injured as a result of your drinking?: No 10. Has a relative or friend or a doctor or another health worker been concerned about your drinking or suggested you cut down?: No Alcohol Use Disorder Identification Test Final Score (AUDIT): 1  Substance Abuse History in the last 12 months:  Yes.     Tobacco Screening:     Past Medical/Surgical History:  Past Medical History:  Diagnosis Date   Diabetes mellitus without complication (HCC)    Hypertension     Past Surgical History:  Procedure Laterality Date   NO PAST SURGERIES      Family History:  Family History  Problem Relation Age of Onset   Hypertension Mother    Diabetes Mother    Hypertension Maternal Grandmother    Diabetes Maternal Grandmother     Family Psychiatric History:  Psychiatric illness: Denies Suicide: Denies Substance Abuse: Maternal grandmother had alcoholism  Social History:  Social  History   Substance and Sexual Activity  Alcohol Use Yes   Comment: patient reports, "social drinker"     Social History   Substance and Sexual Activity  Drug Use Yes   Types: Marijuana    Living situation: Currently lives in [place] with her mother and 48 years old son from previous relationship Social support: Mother is supportive Marital Status: Never married Children: 57 son 14 years old Education: 1 year of college education Employment: Currently works at 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  Glucose, capillary     Status: Abnormal   Collection Time: 04/23/23 11:56 AM  Result Value Ref Range   Glucose-Capillary 285 (H) 70 - 99 mg/dL    Comment: Glucose reference range applies only to samples taken after fasting for at least 8 hours.    Blood Alcohol level:  Lab Results  Component Value Date   ETH <10 04/22/2023    Metabolic Disorder Labs:  Lab Results  Component Value Date   HGBA1C 10.4 (H) 04/22/2023   MPG 251.78 04/22/2023   No results found for: "PROLACTIN" Lab Results  Component Value Date   CHOL 276 (H) 04/22/2023   TRIG 103 04/22/2023   HDL 48 04/22/2023   CHOLHDL 5.8 04/22/2023   VLDL 21 04/22/2023   LDLCALC 207 (H) 04/22/2023    Current Medications: Current Facility-Administered Medications   Medication Dose Route Frequency Provider Last Rate Last Admin   acetaminophen (TYLENOL) tablet 650 mg  650 mg Oral Q6H PRN Ardis Hughs, NP       alum & mag hydroxide-simeth (MAALOX/MYLANTA) 200-200-20 MG/5ML suspension 30 mL  30 mL Oral Q4H PRN Ardis Hughs, NP       amLODipine (NORVASC) tablet 5 mg  5 mg Oral Daily Onuoha, Chinwendu V, NP   5 mg at 04/23/23 0856   cloNIDine (CATAPRES) tablet 0.1 mg  0.1 mg Oral Q6H PRN Abbott Pao, Xitlalli Newhard, MD   0.1 mg at 04/23/23 1049   diphenhydrAMINE (BENADRYL) capsule 50 mg  50 mg Oral TID PRN Ardis Hughs, NP       Or   diphenhydrAMINE (BENADRYL) injection 50 mg  50 mg Intramuscular TID PRN Ardis Hughs, NP       feeding supplement (ENSURE ENLIVE / ENSURE PLUS) liquid 237 mL  237 mL Oral BID BM Onuoha, Chinwendu V, NP       haloperidol (HALDOL) tablet 5 mg  5 mg Oral TID PRN Ardis Hughs, NP       Or   haloperidol lactate (HALDOL) injection 5 mg  5 mg Intramuscular TID PRN Ardis Hughs, NP       hydrOXYzine (ATARAX) tablet 25 mg  25 mg Oral TID PRN Onuoha, Chinwendu V, NP   25 mg at 04/22/23 2138   insulin aspart (novoLOG) injection 0-20 Units  0-20 Units Subcutaneous TID WC Nicha Hemann, MD   11 Units at 04/23/23 1204   LORazepam (ATIVAN) tablet 2 mg  2 mg Oral TID PRN Ardis Hughs, NP       Or   LORazepam (ATIVAN) injection 2 mg  2 mg Intramuscular TID PRN Ardis Hughs, NP       magnesium hydroxide (MILK OF MAGNESIA) suspension 30 mL  30 mL Oral Daily PRN Ardis Hughs, NP       metFORMIN (GLUCOPHAGE-XR) 24 hr tablet 500 mg  500 mg Oral BID WC Ardis Hughs, NP   500 mg at 04/23/23 0856   sertraline (ZOLOFT) tablet 25 mg  25 mg Oral Daily Vernard Gambles H, NP   25 mg at 04/23/23 0856   traZODone (DESYREL) tablet 50 mg  50 mg Oral QHS PRN Ardis Hughs, NP   50 mg at 04/22/23 2138    PTA Medications: Medications Prior to Admission  Medication Sig Dispense Refill Last Dose   amLODipine  (NORVASC) 5 MG tablet  Take 1 tablet (5 mg total) by mouth daily.      metFORMIN (GLUCOPHAGE-XR) 500 MG 24 hr tablet Take 1 tablet (500 mg total) by mouth 2 (two) times daily with a meal.      sertraline (ZOLOFT) 25 MG tablet Take 1 tablet (25 mg total) by mouth daily.       Musculoskeletal: Strength & Muscle Tone: within normal limits Gait & Station: normal Patient leans: N/A   Physical Findings: AIMS:  , ,  ,  ,    CIWA:    COWS:     Psychiatric Specialty Exam:  General Appearance: appears at stated age, fairly dressed and groomed  Behavior: Cooperative  Psychomotor Activity:No psychomotor agitation or retardation noted   Eye Contact: Limited Speech: Normal amount, normal tone and volume   Mood: Moderately dysphoric Affect: Congruent send affect  Thought Process: Linear and goal-directed Descriptions of Associations: Intact yet concrete Thought Content: Hallucinations: Denies AH, VH  Delusions: No paranoia  Suicidal Thoughts: Denies passive or active SI, intention, plan, admits to passive and active SI prior to admission Homicidal Thoughts: Denies HI, intention, plan   Alertness/Orientation: Alert and fully oriented  Insight: poor Judgment: poor  Memory: Intact  Executive Functions  Concentration: Intact Attention Span: Fair Recall: [employer] of Knowledge: Fair   Physical Exam:  Physical Exam Vitals and nursing note reviewed.  Constitutional:      Appearance: Normal appearance.  HENT:     Head: Normocephalic and atraumatic.     Nose: Nose normal.  Eyes:     Pupils: Pupils are equal, round, and reactive to light.  Pulmonary:     Effort: Pulmonary effort is normal.  Musculoskeletal:        General: Normal range of motion.     Cervical back: Normal range of motion.  Neurological:     General: No focal deficit present.     Mental Status: She is alert and oriented to person, place, and time.    Review of Systems  All other systems reviewed and are  negative.  Blood pressure 138/81, pulse 89, temperature 98.1 F (36.7 C), temperature source Oral, resp. rate 16, height 5\' 5"  (1.651 m), weight 97.6 kg, SpO2 100%, currently breastfeeding. Body mass index is 35.81 kg/m.   Assets  Assets:Physical Health; Resilience; Social Support; Desire for Improvement; Financial Resources/Insurance; Manufacturing systems engineer; Housing; Intimacy    Treatment Plan Summary: Daily contact with patient to assess and evaluate symptoms and progress in treatment  ASSESSMENT:  Principal Diagnosis: MDD (major depressive disorder), severe (HCC) Diagnosis:  Principal Problem:   MDD (major depressive disorder), severe (HCC) Active Problems:   Hypertension Diabetes mellitus type 2  PLAN: Safety and Monitoring:  -- Voluntary admission to inpatient psychiatric unit for safety, stabilization and treatment  -- Daily contact with patient to assess and evaluate symptoms and progress in treatment  -- Patient's case to be discussed in multi-disciplinary team meeting  -- Observation Level : q15 minute checks  -- Vital signs:  q12 hours  -- Precautions: suicide, elopement, and assault  2. Medications:   At time of admission patient was started on Zoloft 25 mg daily, will titrate to 50 mg daily to address depression and anxiety, if well-tolerated would titrate to 100 mg in the next couple days.  Atarax 25 mg 3 times daily as needed for anxiety  Trazodone 50 mg at bedtime as needed for sleep  Stop the clonidine 0.1 mg every 6 hours as needed for hypertension  Restart  Norvasc 5 mg daily for hypertension, home medication but patient has not been compliant for the past few months, monitor blood pressure and address accordingly, patient reports has been on labetalol in the past in addition to Norvasc.  Restart Glucophage XR 500 mg twice daily for diabetes, home medication but patient has not been compliant for the past few months  Will obtain diabetes management consult to  assist with diabetes control --  The risks/benefits/side-effects/alternatives to this medication were discussed in detail with the patient and time was given for questions. The patient consents to medication trial.      3. Labs Reviewed: CMP no significant abnormalities noted except for slightly low magnesium 1.6, lipid panel indicated elevated cholesterol 276 and elevated LDL 207, CBC noting low hemoglobin 10.9, low MCV low MCH low MCHC, slightly elevated platelets 450, hemoglobin A1c 10.4, pregnancy test negative, TSH within normal level, UA no UTI, urine drug screen positive for marijuana, EKG 8/5 normal sinus rhythm QTc 412      Lab ordered: Will obtain iron studies and monitor fingerstick blood sugar before meals and at bedtime   4.  Group and Therapy: -- Encouraged patient to participate in unit milieu and in scheduled group therapies   --Substance Use counseling: Patient was counseled regarding need to abstain from marijuana use after discharge, she agrees  5. Discharge Planning:   -- Social work and case management to assist with discharge planning and identification of hospital follow-up needs prior to discharge  -- Estimated LOS: 5-7 days  -- Discharge Concerns: Need to establish a safety plan; Medication compliance and effectiveness  -- Discharge Goals: Return home with outpatient referrals for mental health follow-up including medication management/psychotherapy   The patient is agreeable with the medication plan, as above. We will monitor the patient's response to pharmacologic treatment, and adjust medications as necessary. Patient is encouraged to participate in group therapy while admitted to the psychiatric unit. We will address other chronic and acute stressors, which contributed to the patient's increased depression and SI, in order to reduce the risk of self-harm at discharge.   Physician Treatment Plan for Primary Diagnosis: MDD (major depressive disorder), severe  (HCC) Long Term Goal(s): Improvement in symptoms so as ready for discharge  Short Term Goals: Ability to identify changes in lifestyle to reduce recurrence of condition will improve, Ability to verbalize feelings will improve, Ability to disclose and discuss suicidal ideas, and Ability to demonstrate self-control will improve   I certify that inpatient services furnished can reasonably be expected to improve the patient's condition.    Total Time Spent in Direct Patient Care:  I personally spent 55 minutes on the unit in direct patient care. The direct patient care time included face-to-face time with the patient, reviewing the patient's chart, communicating with other professionals, and coordinating care. Greater than 50% of this time was spent in counseling or coordinating care with the patient regarding goals of hospitalization, psycho-education, and discharge planning needs.     Sarita Bottom, MD 8/6/20242:15 PM

## 2023-04-23 NOTE — Inpatient Diabetes Management (Signed)
Inpatient Diabetes Program Recommendations  AACE/ADA: New Consensus Statement on Inpatient Glycemic Control (2015)  Target Ranges:  Prepandial:   less than 140 mg/dL      Peak postprandial:   less than 180 mg/dL (1-2 hours)      Critically ill patients:  140 - 180 mg/dL   Lab Results  Component Value Date   GLUCAP 229 (H) 04/22/2023   HGBA1C 10.4 (H) 04/22/2023    Review of Glycemic Control  Diabetes history: DM2 Outpatient Diabetes medications: metformin 500 mg BID Current orders for Inpatient glycemic control: metformin 500 mg BID  HgbA1C - 10.4% (average blood sugar 252 mg/dL)  Inpatient Diabetes Program Recommendations:    Consider adding Novolog 0-9 units TID with meals and 0-5 HS  Will need to f/u with PCP for diabetes management and improved diabetes control  Continue to follow.  Thank you. Ailene Ards, RD, LDN, CDCES Inpatient Diabetes Coordinator [phone]

## 2023-04-23 NOTE — BHH Group Notes (Signed)
Psychoeducational Group Note  Date:  04/23/2023 Time:  2000  Group Topic/Focus:  Wrap up group  Participation Level: Did Not Attend  Participation Quality:  Not Applicable  Affect:  Not Applicable  Cognitive:  Not Applicable  Insight:  Not Applicable  Engagement in Group: Not Applicable  Additional Comments:  Did not attend.  Marcille Buffy 04/23/2023, 9:13 PM

## 2023-04-23 NOTE — BHH Suicide Risk Assessment (Signed)
[facility] [place] Admission Suicide Risk Assessment   Nursing information obtained from:  Patient Demographic factors:  Unemployed Current Mental Status:  Suicidal ideation indicated by patient, Thoughts of violence towards others Loss Factors:  Financial problems / change in socioeconomic status, Loss of significant relationship Historical Factors:  Impulsivity Risk Reduction Factors:  Responsible for children under 33 years of age, Sense of responsibility to family, Living with another person, especially a relative, Positive social support  Total Time spent with patient: 45 minutes Principal Problem: MDD (major depressive disorder), severe (HCC) Diagnosis:  Principal Problem:   MDD (major depressive disorder), severe (HCC) Active Problems:   Hypertension  Subjective Data: See H&P  Continued Clinical Symptoms:  Alcohol Use Disorder Identification Test Final Score (AUDIT): 1 The "Alcohol Use Disorders Identification Test", Guidelines for Use in Primary Care, Second Edition.  World Science writer [facility]). Score between 0-7:  no or low risk or alcohol related problems. Score between 8-15:  moderate risk of alcohol related problems. Score between 16-19:  high risk of alcohol related problems. Score 20 or above:  warrants further diagnostic evaluation for alcohol dependence and treatment.   CLINICAL FACTORS:   Depression:   Anhedonia Hopelessness Impulsivity Insomnia   Musculoskeletal: Strength & Muscle Tone: within normal limits Gait & Station: normal Patient leans: N/A  Psychiatric Specialty Exam:  Presentation  General Appearance:  Appropriate for Environment; Casual  Eye Contact: Good  Speech: Clear and Coherent; Normal Rate  Speech Volume: Normal  Handedness: Right   Mood and Affect  Mood: Anxious; Depressed; Worthless  Affect: Tearful; Congruent   Thought Process  Thought Processes: Coherent  Descriptions of Associations:Intact  Orientation:Full (Time,  Place and Person)  Thought Content:Logical  History of Schizophrenia/Schizoaffective disorder:No  Duration of Psychotic Symptoms:No data recorded Hallucinations:Hallucinations: None  Ideas of Reference:None  Suicidal Thoughts:Suicidal Thoughts: Yes, Active SI Active Intent and/or Plan: With Intent; With Plan; With Means to Carry Out  Homicidal Thoughts:Homicidal Thoughts: Yes, Passive HI Passive Intent and/or Plan: Without Intent; Without Plan   Sensorium  Memory: Immediate Good; Recent Good; Remote Good  Judgment: Fair  Insight: Fair   Executive Functions  Concentration: Good  Attention Span: Good  Recall: Good  Fund of Knowledge: Good  Language: Good   Psychomotor Activity  Psychomotor Activity: Psychomotor Activity: Normal   Assets  Assets: Physical Health; Resilience; Social Support; Desire for Improvement; Financial Resources/Insurance; Manufacturing systems engineer; Housing; Intimacy   Sleep  Sleep: Sleep: Poor Number of Hours of Sleep: 5    Physical Exam: Physical Exam ROS Blood pressure 138/81, pulse 89, temperature 98.1 F (36.7 C), temperature source Oral, resp. rate 16, height 5\' 5"  (1.651 m), weight 97.6 kg, SpO2 100%, currently breastfeeding. Body mass index is 35.81 kg/m.   COGNITIVE FEATURES THAT CONTRIBUTE TO RISK:  None    SUICIDE RISK:   Moderate:  Frequent suicidal ideation with limited intensity, and duration, some specificity in terms of plans, no associated intent, good self-control, limited dysphoria/symptomatology, some risk factors present, and identifiable protective factors, including available and accessible social support.  PLAN OF CARE: See H&P  I certify that inpatient services furnished can reasonably be expected to improve the patient's condition.   Analei Whinery Abbott Pao, MD 04/23/2023, 2:13 PM

## 2023-04-23 NOTE — Group Note (Signed)
Recreation Therapy Group Note   Group Topic:Animal Assisted Therapy   Group Date: 04/23/2023 Start Time: 0950 End Time: 1030 Facilitators: Janice Bodine-McCall, LRT,CTRS Location: [address] Dayroom   Animal-Assisted Activity (AAA) Program Checklist/Progress Notes Patient Eligibility Criteria Checklist & Daily Group note for Rec Tx Intervention  AAA/T Program Assumption of Risk Form signed by Patient/ or Parent Legal Guardian Yes  Patient understands his/her participation is voluntary Yes   Affect/Mood: N/A   Participation Level: Did not attend    Clinical Observations/Individualized Feedback:     Plan: Continue to engage patient in RT group sessions 2-3x/week.   Megan Ibarra, LRT,CTRS  04/23/2023 12:47 PM

## 2023-04-23 NOTE — Plan of Care (Signed)
  Problem: Education: Goal: Emotional status will improve Outcome: Progressing Goal: Mental status will improve Outcome: Progressing   

## 2023-04-23 NOTE — Progress Notes (Signed)
D: Patient is alert, oriented, pleasant, and cooperative. Denies SI, HI, AVH, and verbally contracts for safety. Patient reports she slept good last night. Patient reports her appetite as fair, energy level as normal, and concentration as good. Patient rates her depression 2/10, hopelessness 2/10, and anxiety 0/10. Patient denies physical symptoms/pain. Patient blood pressure elevated this morning.     A: Scheduled medications administered per MD order. PRN clonidine administered. Support provided. Patient educated on safety on the unit and medications. Routine safety checks every 15 minutes. Patient stated understanding to tell nurse about any new physical symptoms. Patient understands to tell staff of any needs.     R: No adverse drug reactions noted. Patient verbally contracts for safety. Patient remains safe at this time and will continue to monitor.    04/23/23 1300  Psych Admission Type (Psych Patients Only)  Admission Status Voluntary  Psychosocial Assessment  Patient Complaints Worrying  Eye Contact Fair  Facial Expression Worried  Affect Depressed  Speech Logical/coherent  Interaction Assertive  Motor Activity Other (Comment) (WNL)  Appearance/Hygiene Unremarkable  Behavior Characteristics Cooperative;Appropriate to situation  Mood Depressed  Thought Process  Coherency WDL  Content WDL  Delusions None reported or observed  Perception WDL  Hallucination None reported or observed  Judgment WDL  Confusion None  Danger to Self  Current suicidal ideation? Denies  Agreement Not to Harm Self Yes  Description of Agreement verbal  Danger to Others  Danger to Others None reported or observed  Danger to Others Abnormal  Harmful Behavior to others No threats or harm toward other people  Destructive Behavior No threats or harm toward property

## 2023-04-23 NOTE — Plan of Care (Signed)
  Problem: Education: Goal: Knowledge of [facility] Education information/materials will improve Outcome: Progressing Goal: Verbalization of understanding the information provided will improve Outcome: Progressing   Problem: Education: Goal: Verbalization of understanding the information provided will improve Outcome: Progressing

## 2023-04-23 NOTE — Progress Notes (Signed)
   04/23/23 2113  Psych Admission Type (Psych Patients Only)  Admission Status Voluntary  Psychosocial Assessment  Patient Complaints Anxiety  Eye Contact Fair  Facial Expression Sad;Worried;Trembling lip  Affect Depressed;Sad  Speech Logical/coherent  Interaction Assertive  Motor Activity Other (Comment) (WDL)  Appearance/Hygiene Unremarkable  Behavior Characteristics Cooperative;Appropriate to situation  Mood Depressed;Anxious  Thought Process  Coherency WDL  Content WDL  Delusions None reported or observed  Perception WDL  Hallucination None reported or observed  Judgment WDL  Confusion None  Danger to Self  Current suicidal ideation? Denies (Denies)  Agreement Not to Harm Self Yes  Description of Agreement verbal  Danger to Others  Danger to Others None reported or observed  Danger to Others Abnormal  Harmful Behavior to others No threats or harm toward other people
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
## 2023-04-23 NOTE — Tx Team (Signed)
Initial Treatment Plan 04/23/2023 12:23 AM Megan Ibarra:[id number]    PATIENT STRESSORS: Financial difficulties   Loss of grandmother 2 years ago   Occupational concerns   Substance abuse     PATIENT STRENGTHS: Ability for insight  Average or above average intelligence  Communication skills  Motivation for treatment/growth  Supportive family/friends    PATIENT IDENTIFIED PROBLEMS: Suicidal  Depression  Anger  Grief      "Working on my anger"  "Working on my faith/spiritual walk"       DISCHARGE CRITERIA:  Ability to meet basic life and health needs Improved stabilization in mood, thinking, and/or behavior  PRELIMINARY DISCHARGE PLAN: Outpatient therapy  PATIENT/FAMILY INVOLVEMENT: This treatment plan has been presented to and reviewed with the patient, Megan Ibarra.  The patient and family have been given the opportunity to ask questions and make suggestions.  Marcie Bal, RN 04/23/2023, 12:23 AM

## 2023-04-23 NOTE — Progress Notes (Signed)
   04/23/23 0515  15 Minute Checks  Location Bedroom  Visual Appearance Calm  Behavior Sleeping  Sleep (Behavioral Health Patients Only)  Calculate sleep? (Click Yes once per 24 hr at 0600 safety check) Yes  Documented sleep last 24 hours 4.25

## 2023-04-23 NOTE — Group Note (Signed)
Date:  04/23/2023 Time:  9:55 AM  Group Topic/Focus:  Goals Group:   The focus of this group is to help patients establish daily goals to achieve during treatment and discuss how the patient can incorporate goal setting into their daily lives to aide in recovery.    Participation Level:  Active  Participation Quality:  Appropriate  Affect:  Appropriate  Cognitive:  Appropriate  Insight: Appropriate  Engagement in Group:  Engaged  Modes of Intervention:  Discussion  Additional Comments:     Reymundo Poll 04/23/2023, 9:55 AM

## 2023-04-24 ENCOUNTER — Encounter (HOSPITAL_COMMUNITY): Payer: Self-pay

## 2023-04-24 DIAGNOSIS — F322 Major depressive disorder, single episode, severe without psychotic features: Secondary | ICD-10-CM | POA: Diagnosis not present

## 2023-04-24 LAB — GLUCOSE, CAPILLARY
Glucose-Capillary: 229 mg/dL — ABNORMAL HIGH (ref 70–99)
Glucose-Capillary: 227 mg/dL — ABNORMAL HIGH (ref 70–99)
Glucose-Capillary: 174 mg/dL — ABNORMAL HIGH (ref 70–99)
Glucose-Capillary: 219 mg/dL — ABNORMAL HIGH (ref 70–99)

## 2023-04-24 MED ORDER — SERTRALINE HCL 100 MG PO TABS
100.0000 mg | ORAL_TABLET | Freq: Every day | ORAL | Status: DC
  Administered 2023-04-25 – 2023-04-26 (×2): 100 mg via ORAL
  Filled 2023-04-24 (×4): qty 1

## 2023-04-24 NOTE — BHH Group Notes (Signed)
BHH Group Notes:  (Nursing/MHT/Case Management/Adjunct)  Date:  04/24/2023  Time:  9:17 PM  Type of Therapy:   Wrap-up group  Participation Level:  Active  Participation Quality:  Appropriate  Affect:  Appropriate  Cognitive:  Appropriate  Insight:  Appropriate  Engagement in Group:  Engaged  Modes of Intervention:  Education  Summary of Progress/Problems: Pt goal to attend more groups, pt met goal. Pt rates day 10/10.   [name] 04/24/2023, 9:17 PM

## 2023-04-24 NOTE — Inpatient Diabetes Management (Signed)
Inpatient Diabetes Program Recommendations  AACE/ADA: New Consensus Statement on Inpatient Glycemic Control (2015)  Target Ranges:  Prepandial:   less than 140 mg/dL      Peak postprandial:   less than 180 mg/dL (1-2 hours)      Critically ill patients:  140 - 180 mg/dL   Lab Results  Component Value Date   GLUCAP 229 (H) 04/24/2023   HGBA1C 10.4 (H) 04/22/2023    Review of Glycemic Control  Diabetes history: DM2 Outpatient Diabetes medications: metformin 500 mg BID Current orders for Inpatient glycemic control: Novolog 0-20 TID, metformin 500 mg BID  HgbA1C - 10.4% (average blood sugar 252 mg/dL)  CBGs - 387 this am Needs tighter glycemic control  Inpatient Diabetes Program Recommendations:    Consider adding Semglee 12 units at bedtime  Consider adding Novolog 0-5 HS  Will need f/u with PCP for diabetes management  Lifestyle modification of diet, exercise and weight loss will improve blood sugars and HgbA1C.  Continue to follow.  Thank you. Ailene Ards, RD, LDN, CDCES Inpatient Diabetes Coordinator [phone]

## 2023-04-24 NOTE — Group Note (Signed)
Recreation Therapy Group Note   Group Topic:Team Building  Group Date: 04/24/2023 Start Time: 0930 End Time: 1000 Facilitators: Reyes Aldaco-McCall, LRT,CTRS Location: [address] Dayroom   Goal Area(s) Addresses:  Patient will effectively work with peer towards shared goal.  Patient will identify skills used to make activity successful.  Patient will identify how skills used during activity can be applied to reach post d/c goals.   Group Description: [employer]. In teams of 5-6, patients were given 11 craft pipe cleaners. Using the materials provided, patients were instructed to compete again the opposing team(s) to build the tallest free-standing structure from floor level. The activity was timed; difficulty increased by Clinical research associate as Production designer, theatre/television/film continued.  Systematically resources were removed with additional directions for example, placing one arm behind their back, working in silence, and shape stipulations. LRT facilitated post-activity discussion reviewing team processes and necessary communication skills involved in completion. Patients were encouraged to reflect how the skills utilized, or not utilized, in this activity can be incorporated to positively impact support systems post discharge.   Clinical Observations/Individualized Feedback: Due to limited staffing, group did not occur because LRT was helping on 500 hall by opening the dayroom for the patients.   Plan: Continue to engage patient in RT group sessions 2-3x/week.   Ryelee Albee-McCall, LRT,CTRS 04/24/2023 1:01 PM

## 2023-04-24 NOTE — Progress Notes (Signed)
   04/24/23 2200  Psych Admission Type (Psych Patients Only)  Admission Status Voluntary  Psychosocial Assessment  Patient Complaints None  Eye Contact Fair  Facial Expression Animated  Affect Appropriate to circumstance  Speech Logical/coherent  Interaction Assertive  Motor Activity Other (Comment) (wnl)  Appearance/Hygiene Unremarkable  Behavior Characteristics Cooperative;Appropriate to situation  Mood Pleasant  Thought Process  Coherency WDL  Content WDL  Delusions None reported or observed  Perception WDL  Hallucination None reported or observed  Judgment WDL  Confusion None  Danger to Self  Current suicidal ideation? Denies  Agreement Not to Harm Self Yes  Description of Agreement verbal  Danger to Others  Danger to Others None reported or observed   Progress note   D: Pt seen at nurse's station. Pt denies SI, HI, AVH. Pt rates pain  0/10. Pt rates anxiety  0/10 and depression  0/10. Pt states she has had a good day. The best thing about her day was that she went to the gym and participated in [place]. Pt is concerned about her discharge plan. Encouraged to discuss this with a provider in the morning. No other concerns noted at this time.  A: Pt provided support and encouragement. Pt given scheduled medication as prescribed. PRNs as appropriate. Q15 min checks for safety.   R: Pt safe on the unit. Will continue to monitor.

## 2023-04-24 NOTE — Progress Notes (Signed)
   04/24/23 0602  15 Minute Checks  Location Bedroom  Visual Appearance Calm  Behavior Composed  Sleep (Behavioral Health Patients Only)  Calculate sleep? (Click Yes once per 24 hr at 0600 safety check) Yes  Documented sleep last 24 hours 6.25

## 2023-04-24 NOTE — Progress Notes (Signed)
D: Patient is alert, oriented, pleasant, and cooperative. Denies SI, HI, AVH, and verbally contracts for safety. Patient reports she slept fair last night. Patient reports her appetite as good, energy level as normal, and concentration as good. Patient rates her depression 0/10, hopelessness 0/10, and anxiety 0/10. Patient denies physical symptoms/pain.    A: Scheduled medications administered per MD order. Support provided. Patient educated on safety on the unit and medications. Routine safety checks every 15 minutes. Patient stated understanding to tell nurse about any new physical symptoms. Patient understands to tell staff of any needs.     R: No adverse drug reactions noted. Patient verbally contracts for safety. Patient remains safe at this time and will continue to monitor.    04/24/23 1100  Psych Admission Type (Psych Patients Only)  Admission Status Voluntary  Psychosocial Assessment  Patient Complaints None  Eye Contact Fair  Facial Expression Worried  Affect Depressed  Speech Logical/coherent  Interaction Assertive  Motor Activity Other (Comment) (WNL)  Appearance/Hygiene Unremarkable  Behavior Characteristics Cooperative;Appropriate to situation  Mood Depressed  Thought Process  Coherency WDL  Content WDL  Delusions None reported or observed  Perception WDL  Hallucination None reported or observed  Judgment WDL  Confusion None  Danger to Self  Current suicidal ideation? Denies  Agreement Not to Harm Self Yes  Description of Agreement verbal  Danger to Others  Danger to Others None reported or observed  Danger to Others Abnormal  Harmful Behavior to others No threats or harm toward other people  Destructive Behavior No threats or harm toward property

## 2023-04-24 NOTE — Group Note (Unsigned)
Date:  04/24/2023 Time:  9:09 AM  Group Topic/Focus:  Goals Group:   The focus of this group is to help patients establish daily goals to achieve during treatment and discuss how the patient can incorporate goal setting into their daily lives to aide in recovery.     Participation Level:  {BHH PARTICIPATION UJWJX:91478}  Participation Quality:  {BHH PARTICIPATION QUALITY:22265}  Affect:  {BHH AFFECT:22266}  Cognitive:  {BHH COGNITIVE:22267}  Insight: {BHH Insight2:20797}  Engagement in Group:  {BHH ENGAGEMENT IN GNFAO:13086}  Modes of Intervention:  {BHH MODES OF INTERVENTION:22269}  Additional Comments:  ***  Harriet Masson 04/24/2023, 9:09 AM

## 2023-04-24 NOTE — BH IP Treatment Plan (Signed)
Interdisciplinary Treatment and Diagnostic Plan Update  04/24/2023 Time of Session: 10:45am Megan Ibarra MRN: [id number]  Principal Diagnosis: MDD (major depressive disorder), severe (HCC)  Secondary Diagnoses: Principal Problem:   MDD (major depressive disorder), severe (HCC) Active Problems:   DM type 2 (diabetes mellitus, type 2) (HCC)   Hypertension   Current Medications:  Current Facility-Administered Medications  Medication Dose Route Frequency Provider Last Rate Last Admin   acetaminophen (TYLENOL) tablet 650 mg  650 mg Oral Q6H PRN Ardis Hughs, NP       alum & mag hydroxide-simeth (MAALOX/MYLANTA) 200-200-20 MG/5ML suspension 30 mL  30 mL Oral Q4H PRN Ardis Hughs, NP       amLODipine (NORVASC) tablet 5 mg  5 mg Oral Daily Onuoha, Chinwendu V, NP   5 mg at 04/24/23 8657   cloNIDine (CATAPRES) tablet 0.1 mg  0.1 mg Oral Q6H PRN Sarita Bottom, MD   0.1 mg at 04/23/23 1049   diphenhydrAMINE (BENADRYL) capsule 50 mg  50 mg Oral TID PRN Ardis Hughs, NP       Or   diphenhydrAMINE (BENADRYL) injection 50 mg  50 mg Intramuscular TID PRN Ardis Hughs, NP       feeding supplement (ENSURE ENLIVE / ENSURE PLUS) liquid 237 mL  237 mL Oral BID BM Onuoha, Chinwendu V, NP       haloperidol (HALDOL) tablet 5 mg  5 mg Oral TID PRN Ardis Hughs, NP       Or   haloperidol lactate (HALDOL) injection 5 mg  5 mg Intramuscular TID PRN Ardis Hughs, NP       hydrOXYzine (ATARAX) tablet 25 mg  25 mg Oral TID PRN Onuoha, Chinwendu V, NP   25 mg at 04/23/23 2113   insulin aspart (novoLOG) injection 0-20 Units  0-20 Units Subcutaneous TID WC Attiah, Nadir, MD   7 Units at 04/24/23 1210   LORazepam (ATIVAN) tablet 2 mg  2 mg Oral TID PRN Ardis Hughs, NP       Or   LORazepam (ATIVAN) injection 2 mg  2 mg Intramuscular TID PRN Ardis Hughs, NP       magnesium hydroxide (MILK OF MAGNESIA) suspension 30 mL  30 mL Oral Daily PRN Ardis Hughs, NP        metFORMIN (GLUCOPHAGE-XR) 24 hr tablet 500 mg  500 mg Oral BID WC Ardis Hughs, NP   500 mg at 04/24/23 0824   [START ON 04/25/2023] sertraline (ZOLOFT) tablet 100 mg  100 mg Oral Daily Armandina Stammer I, NP       traZODone (DESYREL) tablet 50 mg  50 mg Oral QHS PRN Ardis Hughs, NP   50 mg at 04/23/23 2114   PTA Medications: Medications Prior to Admission  Medication Sig Dispense Refill Last Dose   amLODipine (NORVASC) 5 MG tablet Take 1 tablet (5 mg total) by mouth daily.      metFORMIN (GLUCOPHAGE-XR) 500 MG 24 hr tablet Take 1 tablet (500 mg total) by mouth 2 (two) times daily with a meal.      sertraline (ZOLOFT) 25 MG tablet Take 1 tablet (25 mg total) by mouth daily.       Patient Stressors: Financial difficulties   Loss of grandmother 2 years ago   Occupational concerns   Substance abuse    Patient Strengths: Ability for insight  Average or above average Copy for treatment/growth  Supportive family/friends  Treatment Modalities: Medication Management, Group therapy, Case management,  1 to 1 session with clinician, Psychoeducation, Recreational therapy.   Physician Treatment Plan for Primary Diagnosis: MDD (major depressive disorder), severe (HCC) Long Term Goal(s): Improvement in symptoms so as ready for discharge   Short Term Goals: Ability to identify changes in lifestyle to reduce recurrence of condition will improve Ability to verbalize feelings will improve Ability to disclose and discuss suicidal ideas Ability to demonstrate self-control will improve  Medication Management: Evaluate patient's response, side effects, and tolerance of medication regimen.  Therapeutic Interventions: 1 to 1 sessions, Unit Group sessions and Medication administration.  Evaluation of Outcomes: Not Progressing  Physician Treatment Plan for Secondary Diagnosis: Principal Problem:   MDD (major depressive disorder), severe  (HCC) Active Problems:   DM type 2 (diabetes mellitus, type 2) (HCC)   Hypertension  Long Term Goal(s): Improvement in symptoms so as ready for discharge   Short Term Goals: Ability to identify changes in lifestyle to reduce recurrence of condition will improve Ability to verbalize feelings will improve Ability to disclose and discuss suicidal ideas Ability to demonstrate self-control will improve     Medication Management: Evaluate patient's response, side effects, and tolerance of medication regimen.  Therapeutic Interventions: 1 to 1 sessions, Unit Group sessions and Medication administration.  Evaluation of Outcomes: Not Progressing   RN Treatment Plan for Primary Diagnosis: MDD (major depressive disorder), severe (HCC) Long Term Goal(s): Knowledge of disease and therapeutic regimen to maintain health will improve  Short Term Goals: Ability to remain free from injury will improve, Ability to verbalize frustration and anger appropriately will improve, Ability to participate in decision making will improve, Ability to verbalize feelings will improve, Ability to identify and develop effective coping behaviors will improve, and Compliance with prescribed medications will improve  Medication Management: RN will administer medications as ordered by provider, will assess and evaluate patient's response and provide education to patient for prescribed medication. RN will report any adverse and/or side effects to prescribing provider.  Therapeutic Interventions: 1 on 1 counseling sessions, Psychoeducation, Medication administration, Evaluate responses to treatment, Monitor vital signs and CBGs as ordered, Perform/monitor CIWA, COWS, AIMS and Fall Risk screenings as ordered, Perform wound care treatments as ordered.  Evaluation of Outcomes: Not Progressing   LCSW Treatment Plan for Primary Diagnosis: MDD (major depressive disorder), severe (HCC) Long Term Goal(s): Safe transition to  appropriate next level of care at discharge, Engage patient in therapeutic group addressing interpersonal concerns.  Short Term Goals: Engage patient in aftercare planning with referrals and resources, Increase social support, Increase emotional regulation, Facilitate acceptance of mental health diagnosis and concerns, Identify triggers associated with mental health/substance abuse issues, and Increase skills for wellness and recovery  Therapeutic Interventions: Assess for all discharge needs, 1 to 1 time with Social worker, Explore available resources and support systems, Assess for adequacy in community support network, Educate family and significant other(s) on suicide prevention, Complete Psychosocial Assessment, Interpersonal group therapy.  Evaluation of Outcomes: Not Progressing   Progress in Treatment: Attending groups: No. Participating in groups: No. Taking medication as prescribed: Yes. Toleration medication: Yes. Family/Significant other contact made: Yes, CSW will contact mother Orrin Brigham [phone].  Patient understands diagnosis: Yes. Discussing patient identified problems/goals with staff: Yes. Medical problems stabilized or resolved: Yes. Denies suicidal/homicidal ideation: Yes. Issues/concerns per patient self-inventory: No.  New problem(s) identified: No, Describe:  None reported  New Short Term/Long Term Goal(s):medication stabilization, elimination of SI thoughts, development of comprehensive mental wellness plan.  Patient Goals: "learn how to cope with stress."  Discharge Plan or Barriers: Patient recently admitted. CSW will continue to follow and assess for appropriate referrals and possible discharge planning.    Reason for Continuation of Hospitalization: Depression Homicidal ideation Medication stabilization Suicidal ideation  Estimated Length of Stay: 3-5 days   Last 3 [place] Suicide Severity Risk Score: Flowsheet Row Ad[facility] (Current) [facility]  [facility] Most recent reading at 04/22/2023  8:00 PM ED from 04/22/2023 in [facility] Most recent reading at 04/22/2023 12:00 PM ED from 03/30/2023 in [facility] at [facility] Most recent reading at 03/30/2023  5:03 AM  C-SSRS RISK CATEGORY High Risk No Risk No Risk       Last PHQ 2/9 Scores:     No data to display          Scribe for Treatment Team: Izell [place], LCSW 04/24/2023 2:01 PM

## 2023-04-24 NOTE — BHH Group Notes (Signed)
Adult Psychoeducational Group Note  Date:  04/24/2023 Time:  9:35 PM  Group Topic/Focus:  Wrap-Up Group:   The focus of this group is to help patients review their daily goal of treatment and discuss progress on daily workbooks.  Participation Level:  Active  Participation Quality:  Appropriate  Affect:  Appropriate  Cognitive:  Appropriate  Insight: Appropriate  Engagement in Group:  Engaged  Modes of Intervention:  Discussion and Support  Additional Comments:  Pt attended the evening NA group.  Christ Kick 04/24/2023, 9:35 PM

## 2023-04-24 NOTE — Progress Notes (Signed)
[facility] MD Progress Note  04/24/2023 2:59 PM Megan Ibarra  MRN:  [id number]  Reason for admission:  32 y.o., female with a past psychiatric history significant for anxiety disorder who presents to the [facility] from behavioral health urgent care for evaluation and management of worsening depression and SI.  According to outside records, the patient reported herself to urgent care for increased depression over the past few months after stopping the medications for depression as well as her medical medications for hypertension and diabetes  Daily notes: Ayira is seen. Chart reviewed. The chart findings discussed with the treatment team. She presents alert, oriented & aware of situation. She is visible on the unit, attending group sessions. She reports, "I'm doing okay here. But I was not doing okay mentally & emotionally for a while. The issue I had was, I did not know how to deal with life's unplanned occurrences. I felt like all of a sudden, life just hit me hard. It started with my grandmother's death. Then I lost my job & my car broke down. I did not know what to do after I lost my job & did not have a way to get around. I have to move in with my mother. Then, I thought about just dying to not have to deal with this any more. So, the other day, I got in a car, rolled up all the windows, ready to die. Then I thought about my son & my grandmother who just passed. It was then that I had a change of heart. That was the reason I'm here today. I have started a much high dose of Sertraline that I was prior to being hospitalized. I have been going to all the group sessions so far. I'm sleeping well with medication & my appetite is good. My goal now is to learn how to cope with my grief because I miss my grandmother so much. I have no side effects to my medicines". Dezyrae currently denies any SIHI, AVH, delusional thoughts or paranoia. She does not appear to be responding to any internal  stimuli. This case is discussed with the attending psychiatrist. Patient's Zoloft is increased from 50 mg to 100 mg to start tomorrow morning, Patient is encouraged to continue to take her medications as recommended. Report any side effects promptly & to continue to attend & participate in the group sessions. Reviewed vital signs, stable. However, her pulse rate is slightly elevated (105). Patient is in no apparent distress. There are no new lab results. Continue current plan of care as already in progress.  Principal Problem: MDD (major depressive disorder), severe (HCC)  Diagnosis: Principal Problem:   MDD (major depressive disorder), severe (HCC) Active Problems:   DM type 2 (diabetes mellitus, type 2) (HCC)   Hypertension  Total Time spent with patient:  45 minutes.  Past Psychiatric History: See H&P.  Past Medical History:  Past Medical History:  Diagnosis Date   Diabetes mellitus without complication (HCC)    Hypertension     Past Surgical History:  Procedure Laterality Date   NO PAST SURGERIES     Family History:  Family History  Problem Relation Age of Onset   Hypertension Mother    Diabetes Mother    Hypertension Maternal Grandmother    Diabetes Maternal Grandmother    Family Psychiatric  History: See H&P.  Social History:  Social History   Substance and Sexual Activity  Alcohol Use Yes   Comment: patient reports, "social drinker"  Social History   Substance and Sexual Activity  Drug Use Yes   Types: Marijuana    Social History   Socioeconomic History   Marital status: Single    Spouse name: Not on file   Number of children: Not on file   Years of education: Not on file   Highest education level: Not on file  Occupational History   Not on file  Tobacco Use   Smoking status: Former    Current packs/day: 0.00    Types: Cigarettes    Quit date: 07/02/2016    Years since quitting: 6.8   Smokeless tobacco: Never  Vaping Use   Vaping status:  Never Used  Substance and Sexual Activity   Alcohol use: Yes    Comment: patient reports, "social drinker"   Drug use: Yes    Types: Marijuana   Sexual activity: Yes    Birth control/protection: None  Other Topics Concern   Not on file  Social History Narrative   Not on file   Social Determinants of Health   Financial Resource Strain: Not on file  Food Insecurity: Food Insecurity Present (04/22/2023)   Hunger Vital Sign    Worried About Running Out of Food in the Last Year: Sometimes true    Ran Out of Food in the Last Year: Sometimes true  Transportation Needs: Unmet Transportation Needs (04/22/2023)   PRAPARE - Administrator, Civil Service (Medical): Yes    Lack of Transportation (Non-Medical): Yes  Physical Activity: Not on file  Stress: Not on file  Social Connections: Not on file   Additional Social History:   Sleep: Good  Appetite:  Good  Current Medications: Current Facility-Administered Medications  Medication Dose Route Frequency Provider Last Rate Last Admin   acetaminophen (TYLENOL) tablet 650 mg  650 mg Oral Q6H PRN Ardis Hughs, NP       alum & mag hydroxide-simeth (MAALOX/MYLANTA) 200-200-20 MG/5ML suspension 30 mL  30 mL Oral Q4H PRN Ardis Hughs, NP       amLODipine (NORVASC) tablet 5 mg  5 mg Oral Daily Onuoha, Chinwendu V, NP   5 mg at 04/24/23 0981   diphenhydrAMINE (BENADRYL) capsule 50 mg  50 mg Oral TID PRN Ardis Hughs, NP       Or   diphenhydrAMINE (BENADRYL) injection 50 mg  50 mg Intramuscular TID PRN Ardis Hughs, NP       feeding supplement (ENSURE ENLIVE / ENSURE PLUS) liquid 237 mL  237 mL Oral BID BM Onuoha, Chinwendu V, NP       haloperidol (HALDOL) tablet 5 mg  5 mg Oral TID PRN Ardis Hughs, NP       Or   haloperidol lactate (HALDOL) injection 5 mg  5 mg Intramuscular TID PRN Ardis Hughs, NP       hydrOXYzine (ATARAX) tablet 25 mg  25 mg Oral TID PRN Onuoha, Chinwendu V, NP   25 mg at  04/23/23 2113   insulin aspart (novoLOG) injection 0-20 Units  0-20 Units Subcutaneous TID WC Attiah, Nadir, MD   7 Units at 04/24/23 1210   LORazepam (ATIVAN) tablet 2 mg  2 mg Oral TID PRN Ardis Hughs, NP       Or   LORazepam (ATIVAN) injection 2 mg  2 mg Intramuscular TID PRN Ardis Hughs, NP       magnesium hydroxide (MILK OF MAGNESIA) suspension 30 mL  30 mL Oral Daily PRN Effie Shy,  Liane Comber, NP       metFORMIN (GLUCOPHAGE-XR) 24 hr tablet 500 mg  500 mg Oral BID WC Ardis Hughs, NP   500 mg at 04/24/23 0824   [START ON 04/25/2023] sertraline (ZOLOFT) tablet 100 mg  100 mg Oral Daily Armandina Stammer I, NP       traZODone (DESYREL) tablet 50 mg  50 mg Oral QHS PRN Ardis Hughs, NP   50 mg at 04/23/23 2114   Lab Results:  Results for orders placed or performed during the hospital encounter of 04/22/23 (from the past 48 hour(s))  Glucose, capillary     Status: Abnormal   Collection Time: 04/22/23  8:35 PM  Result Value Ref Range   Glucose-Capillary 229 (H) 70 - 99 mg/dL    Comment: Glucose reference range applies only to samples taken after fasting for at least 8 hours.  Glucose, capillary     Status: Abnormal   Collection Time: 04/23/23 11:56 AM  Result Value Ref Range   Glucose-Capillary 285 (H) 70 - 99 mg/dL    Comment: Glucose reference range applies only to samples taken after fasting for at least 8 hours.  Glucose, capillary     Status: Abnormal   Collection Time: 04/23/23  5:00 PM  Result Value Ref Range   Glucose-Capillary 187 (H) 70 - 99 mg/dL    Comment: Glucose reference range applies only to samples taken after fasting for at least 8 hours.  Glucose, capillary     Status: Abnormal   Collection Time: 04/23/23  9:34 PM  Result Value Ref Range   Glucose-Capillary 221 (H) 70 - 99 mg/dL    Comment: Glucose reference range applies only to samples taken after fasting for at least 8 hours.  Glucose, capillary     Status: Abnormal   Collection Time: 04/24/23   5:55 AM  Result Value Ref Range   Glucose-Capillary 229 (H) 70 - 99 mg/dL    Comment: Glucose reference range applies only to samples taken after fasting for at least 8 hours.   Comment 1 Notify RN    Comment 2 Document in Chart   Glucose, capillary     Status: Abnormal   Collection Time: 04/24/23 11:47 AM  Result Value Ref Range   Glucose-Capillary 227 (H) 70 - 99 mg/dL    Comment: Glucose reference range applies only to samples taken after fasting for at least 8 hours.   Comment 1 Notify RN    Comment 2 Document in Chart    Blood Alcohol level:  Lab Results  Component Value Date   ETH <10 04/22/2023   Metabolic Disorder Labs: Lab Results  Component Value Date   HGBA1C 10.4 (H) 04/22/2023   MPG 251.78 04/22/2023   No results found for: "PROLACTIN" Lab Results  Component Value Date   CHOL 276 (H) 04/22/2023   TRIG 103 04/22/2023   HDL 48 04/22/2023   CHOLHDL 5.8 04/22/2023   VLDL 21 04/22/2023   LDLCALC 207 (H) 04/22/2023   Physical Findings: AIMS:  , ,  ,  ,    CIWA:    COWS:     Musculoskeletal: Strength & Muscle Tone: within normal limits Gait & Station: normal Patient leans: N/A  Psychiatric Specialty Exam:  Presentation  General Appearance:  Casual; Disheveled  Eye Contact: Good  Speech: Clear and Coherent; Normal Rate  Speech Volume: Normal  Handedness: Right   Mood and Affect  Mood: Anxious; Depressed (but reports some improvement.)  Affect: Congruent  Thought Process  Thought Processes: Coherent; Goal Directed; Linear  Descriptions of Associations:Intact  Orientation:Full (Time, Place and Person)  Thought Content:Logical  History of Schizophrenia/Schizoaffective disorder:No  Duration of Psychotic Symptoms:No data recorded Hallucinations:Hallucinations: None  Ideas of Reference:None  Suicidal Thoughts:Suicidal Thoughts: No SI Active Intent and/or Plan: Without Intent; Without Plan; Without Means to Carry Out; Without  Access to Means  Homicidal Thoughts:Homicidal Thoughts: No HI Passive Intent and/or Plan: Without Intent; Without Plan; Without Means to Carry Out; Without Access to Means  Sensorium  Memory: Immediate Good; Recent Good; Remote Good  Judgment: Fair  Insight: Good  Executive Functions  Concentration: Good  Attention Span: Good  Recall: Good  Fund of Knowledge: Fair  Language: Good   Psychomotor Activity  Psychomotor Activity:Psychomotor Activity: Normal  Assets  Assets: Communication Skills; Desire for Improvement; Resilience; Social Support  Sleep  Sleep:Sleep: Good Number of Hours of Sleep: 7.5  Physical Exam: Physical Exam Vitals and nursing note reviewed.  HENT:     Nose: Nose normal.     Mouth/Throat:     Pharynx: Oropharynx is clear.  Eyes:     Pupils: Pupils are equal, round, and reactive to light.  Cardiovascular:     Rate and Rhythm: Normal rate.     Pulses: Normal pulses.  Pulmonary:     Effort: Pulmonary effort is normal.  Genitourinary:    Comments: Deferred Musculoskeletal:        General: Normal range of motion.     Cervical back: Normal range of motion.  Neurological:     General: No focal deficit present.     Mental Status: She is alert and oriented to person, place, and time.    Review of Systems  Constitutional:  Negative for chills, diaphoresis and fever.  HENT:  Negative for congestion and sore throat.   Eyes:  Negative for blurred vision.  Respiratory:  Negative for cough, shortness of breath and wheezing.   Cardiovascular:  Negative for chest pain and palpitations.  Gastrointestinal:  Negative for abdominal pain, constipation, diarrhea, heartburn, nausea and vomiting.  Musculoskeletal:  Negative for joint pain and myalgias.  Neurological:  Negative for dizziness, tingling, tremors, sensory change, speech change, focal weakness, seizures, loss of consciousness, weakness and headaches.  Psychiatric/Behavioral:  Positive  for depression, hallucinations and substance abuse (UDS (+) for THC). Negative for memory loss and suicidal ideas. The patient is not nervous/anxious and does not have insomnia.    Blood pressure 136/86, pulse (!) 105, temperature 98.3 F (36.8 C), temperature source Oral, resp. rate 20, height 5\' 5"  (1.651 m), weight 97.6 kg, SpO2 100%, currently breastfeeding. Body mass index is 35.81 kg/m.  Treatment Plan Summary: Daily contact with patient to assess and evaluate symptoms and progress in treatment and Medication management.   Continue inpatient hospitalization.  Will continue today 04/24/2023 plan as below except where it is noted.    ASSESSMENT:   Principal Diagnosis: MDD (major depressive disorder), severe (HCC) Diagnosis:  Principal Problem:   MDD (major depressive disorder), severe (HCC) Active medical problems: Hypertension Diabetes mellitus type 2   PLAN: Safety and Monitoring:             -- Voluntary admission to inpatient psychiatric unit for safety, stabilization and treatment             -- Daily contact with patient to assess and evaluate symptoms and progress in treatment             -- Patient's case to be  discussed in multi-disciplinary team meeting             -- Observation Level : q15 minute checks             -- Vital signs:  q12 hours             -- Precautions: suicide, elopement, and assault   2. Medications:              (At time of admission patient was started on Zoloft 25 mg daily, will titrate to 50 mg daily to address depression and anxiety, if well-tolerated would titrate to 100 mg in the next couple days).             Increased Zoloft from 50 mg to Zoloft 100 mg po daily for depression starting 04/25/23.             Continue Atarax 25 mg 3 times daily as needed for anxiety             Continue Trazodone 50 mg at bedtime as needed for sleep             Stop the clonidine 0.1 mg every 6 hours as needed for hypertension             Continue Norvasc 5 mg  daily for hypertension, home medication but patient has not been compliant for the past few months, monitor blood pressure and address accordingly, patient reports has been on labetalol in the past in addition to Norvasc.             Continue Glucophage XR 500 mg twice daily for diabetes, home medication but patient has not been compliant for the past few months             Will obtain diabetes management consult to assist with diabetes control --  The risks/benefits/side-effects/alternatives to this medication were discussed in detail with the patient and time was given for questions. The patient consents to medication trial.                            3. Labs Reviewed: CMP no significant abnormalities noted except for slightly low magnesium 1.6, lipid panel indicated elevated cholesterol 276 and elevated LDL 207, CBC noting low hemoglobin 10.9, low MCV low MCH low MCHC, slightly elevated platelets 450, hemoglobin A1c 10.4, pregnancy test negative, TSH within normal level, UA no UTI, urine drug screen positive for marijuana, EKG 8/5 normal sinus rhythm QTc 412       Lab ordered: Will obtain iron studies and monitor fingerstick blood sugar before meals and at bedtime   4.  Group and Therapy: -- Encouraged patient to participate in unit milieu and in scheduled group therapies              --Substance Use counseling: Patient was counseled regarding need to abstain from marijuana use after discharge, she agrees   5. Discharge Planning:              -- Social work and case management to assist with discharge planning and identification of hospital follow-up needs prior to discharge             -- Estimated LOS: 5-7 days             -- Discharge Concerns: Need to establish a safety plan; Medication compliance and effectiveness             -- Discharge  Goals: Return home with outpatient referrals for mental health follow-up including medication management/psychotherapy   Armandina Stammer, NP, pmhnp,  fnp-BC 04/24/2023, 2:59 PM

## 2023-04-25 DIAGNOSIS — F322 Major depressive disorder, single episode, severe without psychotic features: Secondary | ICD-10-CM | POA: Diagnosis not present

## 2023-04-25 LAB — GLUCOSE, CAPILLARY
Glucose-Capillary: 262 mg/dL — ABNORMAL HIGH (ref 70–99)
Glucose-Capillary: 155 mg/dL — ABNORMAL HIGH (ref 70–99)
Glucose-Capillary: 136 mg/dL — ABNORMAL HIGH (ref 70–99)

## 2023-04-25 MED ORDER — INSULIN GLARGINE-YFGN 100 UNIT/ML ~~LOC~~ SOLN
12.0000 [IU] | Freq: Every day | SUBCUTANEOUS | Status: DC
  Administered 2023-04-25: 12 [IU] via SUBCUTANEOUS

## 2023-04-25 MED ORDER — ONDANSETRON HCL 4 MG PO TABS
4.0000 mg | ORAL_TABLET | Freq: Three times a day (TID) | ORAL | Status: DC | PRN
  Administered 2023-04-25: 4 mg via ORAL
  Filled 2023-04-25: qty 1

## 2023-04-25 NOTE — Progress Notes (Addendum)
Pt signed 72 hour request for discharge at [phone]. Provider notified.

## 2023-04-25 NOTE — Progress Notes (Signed)
   04/25/23 0557  15 Minute Checks  Location Bedroom  Visual Appearance Calm  Behavior Sleeping  Sleep (Behavioral Health Patients Only)  Calculate sleep? (Click Yes once per 24 hr at 0600 safety check) Yes  Documented sleep last 24 hours 6

## 2023-04-25 NOTE — Progress Notes (Signed)
I assumed car for Ms Megan Ibarra at about 07:30. She was resting in her room, socializing with a peer, in no apparent distress. Pt denied any new pain, abg as documented, pt received her scheduled insulin as ordered. Vital signs stable, she attended groups and participated appropriately. She is looking forward to discharge from hospital soon. She is being monitored as ordered.

## 2023-04-25 NOTE — [facility] Group Notes (Signed)
Adult Psychoeducational Group Note  Date:  04/25/2023 Time:  9:52 PM  Group Topic/Focus:  Wrap-Up Group:   The focus of this group is to help patients review their daily goal of treatment and discuss progress on daily workbooks.  Participation Level:  Active  Participation Quality:  Appropriate  Affect:  Appropriate  Cognitive:  Appropriate  Insight: Appropriate  Engagement in Group:  Engaged  Modes of Intervention:  Discussion and Support  Additional Comments:  Pt told that today was a good day on the unit, the highlight of which was simply waking up. On the subject of staying well upon discharge, Pt mentioned wanting to follow up with a regular therapist and also to "avoid trickers in life." Pt rated her day an 8 out of 10.  Christ Kick 04/25/2023, 9:52 PM

## 2023-04-25 NOTE — Plan of Care (Signed)
  Problem: Education: Goal: Emotional status will improve Outcome: Progressing Goal: Mental status will improve Outcome: Progressing   Problem: Activity: Goal: Interest or engagement in activities will improve Outcome: Progressing Goal: Sleeping patterns will improve Outcome: Progressing   Problem: Coping: Goal: Ability to verbalize frustrations and anger appropriately will improve Outcome: Progressing Goal: Ability to demonstrate self-control will improve Outcome: Progressing

## 2023-04-25 NOTE — Plan of Care (Signed)
  Problem: Education: Goal: Knowledge of [facility] Education information/materials will improve Outcome: Progressing Goal: Emotional status will improve Outcome: Progressing Goal: Mental status will improve Outcome: Progressing Goal: Verbalization of understanding the information provided will improve Outcome: Progressing   Problem: Activity: Goal: Interest or engagement in activities will improve Outcome: Progressing Goal: Sleeping patterns will improve Outcome: Progressing   Problem: Coping: Goal: Ability to verbalize frustrations and anger appropriately will improve Outcome: Progressing Goal: Ability to demonstrate self-control will improve Outcome: Progressing   Problem: Health Behavior/Discharge Planning: Goal: Identification of resources available to assist in meeting health care needs will improve Outcome: Progressing Goal: Compliance with treatment plan for underlying cause of condition will improve Outcome: Progressing   Problem: Physical Regulation: Goal: Ability to maintain clinical measurements within normal limits will improve Outcome: Progressing   Problem: Safety: Goal: Periods of time without injury will increase Outcome: Progressing   Problem: Education: Goal: Ability to describe self-care measures that may prevent or decrease complications (Diabetes Survival Skills Education) will improve Outcome: Progressing Goal: Individualized Educational Video(s) Outcome: Progressing   Problem: Coping: Goal: Ability to adjust to condition or change in health will improve Outcome: Progressing   Problem: Fluid Volume: Goal: Ability to maintain a balanced intake and output will improve Outcome: Progressing

## 2023-04-25 NOTE — Progress Notes (Signed)
[facility] MD Progress Note  04/25/2023 3:41 PM Megan Ibarra  MRN:  [id number]  Reason for admission:  32 y.o., female with a past psychiatric history significant for anxiety disorder who presents to the [facility] from behavioral health urgent care for evaluation and management of worsening depression and SI.  According to outside records, the patient reported herself to urgent care for increased depression over the past few months after stopping the medications for depression as well as her medical medications for hypertension and diabetes  Daily notes: Raseel is seen. Chart reviewed. The chart findings discussed with the treatment team. She presents alert, oriented & aware of situation. She is visible on the unit, attending group sessions. She presents with an angry affect. She reports, "I'm ready to get discharged. I'm not getting what I need from here & what I came here for. I feel like I'm only being fed medicines. I do not think that I will get what I need from here. I need one on one therapy sessions. All I have had here is group therapy. I feel like I'm in jail. I have signed my 72 hour discharge notice because I want to go home. I miss my son". Patient is informed that she is in the hospital because she was thinking about hurting herself & also depressed due multiple stressor (recent death of grandmother, job loss & moving in with her mother due to financial difficulties). She was explained that prior to coming to the hospital. She was already on a low dose of Sertraline for depression. She was informed that her treatment team are currently titrating the dose of the Sertraline to meet her need while she is being monitored to assure that she is tolerating the medicines. She was also explained that there are no individual therapies being provided at the hospital, however, part of her discharge plan is to refer her to a therapist who probably will be providing individual counseling for  her. Even with the explanation, patient remains adamant about getting discharged. When she was also explained that her treatment team will also honor her 72 hour discharge notice as along as her mood remains stable & safe. Patient did ask how many days are in 72 hours? She was told 3 days, patient became even more concerned/upset & state that she will not be able to stay any longer than she had because she was unaware that she will have to stay another 3 more days in this hospital. Megan Ibarra currently denies any SIHI, AVH, delusional thoughts or paranoia. She does not appear to be responding to any internal stimuli. She denies any side effects from her medications. This case was discussed with the attending psychiatrist. An agreement was reached that if patient maintains a civil attitude & a stable mood, she will be discharged tomorrow morning. There are currently no changes made on the current plan of care today. Will continue as already in progress. Vital signs remain stable. Reviewed the recommendation of glycemic control by the registered dietitian as noted below; Needs tighter glycemic control  Inpatient Diabetes Program Recommendations:    Consider adding Semglee 12 units at bedtime  Consider adding Novolog 0-5 HS (Patient is already on the sliding scale coverage).   Will need f/u with PCP for diabetes management  Lifestyle modification of diet, exercise and weight loss will improve blood sugars and HgbA1C.  Principal Problem: MDD (major depressive disorder), severe (HCC)  Diagnosis: Principal Problem:   MDD (major depressive disorder), severe (HCC) Active  Problems:   DM type 2 (diabetes mellitus, type 2) (HCC)   Hypertension  Total Time spent with patient:  45 minutes.  Past Psychiatric History: See H&P.  Past Medical History:  Past Medical History:  Diagnosis Date   Diabetes mellitus without complication (HCC)    Hypertension     Past Surgical History:  Procedure Laterality Date    NO PAST SURGERIES     Family History:  Family History  Problem Relation Age of Onset   Hypertension Mother    Diabetes Mother    Hypertension Maternal Grandmother    Diabetes Maternal Grandmother    Family Psychiatric  History: See H&P.  Social History:  Social History   Substance and Sexual Activity  Alcohol Use Yes   Comment: patient reports, "social drinker"     Social History   Substance and Sexual Activity  Drug Use Yes   Types: Marijuana    Social History   Socioeconomic History   Marital status: Single    Spouse name: Not on file   Number of children: Not on file   Years of education: Not on file   Highest education level: Not on file  Occupational History   Not on file  Tobacco Use   Smoking status: Former    Current packs/day: 0.00    Types: Cigarettes    Quit date: 07/02/2016    Years since quitting: 6.8   Smokeless tobacco: Never  Vaping Use   Vaping status: Never Used  Substance and Sexual Activity   Alcohol use: Yes    Comment: patient reports, "social drinker"   Drug use: Yes    Types: Marijuana   Sexual activity: Yes    Birth control/protection: None  Other Topics Concern   Not on file  Social History Narrative   Not on file   Social Determinants of Health   Financial Resource Strain: Not on file  Food Insecurity: Food Insecurity Present (04/22/2023)   Hunger Vital Sign    Worried About Running Out of Food in the Last Year: Sometimes true    Ran Out of Food in the Last Year: Sometimes true  Transportation Needs: Unmet Transportation Needs (04/22/2023)   PRAPARE - Administrator, Civil Service (Medical): Yes    Lack of Transportation (Non-Medical): Yes  Physical Activity: Not on file  Stress: Not on file  Social Connections: Not on file   Additional Social History:   Sleep: Good  Appetite:  Good  Current Medications: Current Facility-Administered Medications  Medication Dose Route Frequency Provider Last Rate Last  Admin   acetaminophen (TYLENOL) tablet 650 mg  650 mg Oral Q6H PRN Ardis Hughs, NP       alum & mag hydroxide-simeth (MAALOX/MYLANTA) 200-200-20 MG/5ML suspension 30 mL  30 mL Oral Q4H PRN Ardis Hughs, NP       amLODipine (NORVASC) tablet 5 mg  5 mg Oral Daily Onuoha, Chinwendu V, NP   5 mg at 04/25/23 0811   diphenhydrAMINE (BENADRYL) capsule 50 mg  50 mg Oral TID PRN Ardis Hughs, NP       Or   diphenhydrAMINE (BENADRYL) injection 50 mg  50 mg Intramuscular TID PRN Ardis Hughs, NP       feeding supplement (ENSURE ENLIVE / ENSURE PLUS) liquid 237 mL  237 mL Oral BID BM Onuoha, Chinwendu V, NP       haloperidol (HALDOL) tablet 5 mg  5 mg Oral TID PRN Ardis Hughs, NP  Or   haloperidol lactate (HALDOL) injection 5 mg  5 mg Intramuscular TID PRN Ardis Hughs, NP       hydrOXYzine (ATARAX) tablet 25 mg  25 mg Oral TID PRN Onuoha, Chinwendu V, NP   25 mg at 04/24/23 2109   insulin aspart (novoLOG) injection 0-20 Units  0-20 Units Subcutaneous TID WC Attiah, Nadir, MD   11 Units at 04/25/23 1251   LORazepam (ATIVAN) tablet 2 mg  2 mg Oral TID PRN Ardis Hughs, NP       Or   LORazepam (ATIVAN) injection 2 mg  2 mg Intramuscular TID PRN Ardis Hughs, NP       magnesium hydroxide (MILK OF MAGNESIA) suspension 30 mL  30 mL Oral Daily PRN Ardis Hughs, NP       metFORMIN (GLUCOPHAGE-XR) 24 hr tablet 500 mg  500 mg Oral BID WC Ardis Hughs, NP   500 mg at 04/25/23 1610   sertraline (ZOLOFT) tablet 100 mg  100 mg Oral Daily Armandina Stammer I, NP   100 mg at 04/25/23 9604   traZODone (DESYREL) tablet 50 mg  50 mg Oral QHS PRN Ardis Hughs, NP   50 mg at 04/24/23 2109   Lab Results:  Results for orders placed or performed during the hospital encounter of 04/22/23 (from the past 48 hour(s))  Glucose, capillary     Status: Abnormal   Collection Time: 04/23/23  5:00 PM  Result Value Ref Range   Glucose-Capillary 187 (H) 70 - 99 mg/dL     Comment: Glucose reference range applies only to samples taken after fasting for at least 8 hours.  Glucose, capillary     Status: Abnormal   Collection Time: 04/23/23  9:34 PM  Result Value Ref Range   Glucose-Capillary 221 (H) 70 - 99 mg/dL    Comment: Glucose reference range applies only to samples taken after fasting for at least 8 hours.  Glucose, capillary     Status: Abnormal   Collection Time: 04/24/23  5:55 AM  Result Value Ref Range   Glucose-Capillary 229 (H) 70 - 99 mg/dL    Comment: Glucose reference range applies only to samples taken after fasting for at least 8 hours.   Comment 1 Notify RN    Comment 2 Document in Chart   Glucose, capillary     Status: Abnormal   Collection Time: 04/24/23 11:47 AM  Result Value Ref Range   Glucose-Capillary 227 (H) 70 - 99 mg/dL    Comment: Glucose reference range applies only to samples taken after fasting for at least 8 hours.   Comment 1 Notify RN    Comment 2 Document in Chart   Glucose, capillary     Status: Abnormal   Collection Time: 04/24/23  5:07 PM  Result Value Ref Range   Glucose-Capillary 174 (H) 70 - 99 mg/dL    Comment: Glucose reference range applies only to samples taken after fasting for at least 8 hours.   Comment 1 Notify RN    Comment 2 Document in Chart   Glucose, capillary     Status: Abnormal   Collection Time: 04/24/23  9:39 PM  Result Value Ref Range   Glucose-Capillary 219 (H) 70 - 99 mg/dL    Comment: Glucose reference range applies only to samples taken after fasting for at least 8 hours.   Comment 1 Notify RN    Comment 2 Document in Chart   Glucose, capillary  Status: Abnormal   Collection Time: 04/25/23 11:51 AM  Result Value Ref Range   Glucose-Capillary 262 (H) 70 - 99 mg/dL    Comment: Glucose reference range applies only to samples taken after fasting for at least 8 hours.   Blood Alcohol level:  Lab Results  Component Value Date   ETH <10 04/22/2023   Metabolic Disorder Labs: Lab  Results  Component Value Date   HGBA1C 10.4 (H) 04/22/2023   MPG 251.78 04/22/2023   No results found for: "PROLACTIN" Lab Results  Component Value Date   CHOL 276 (H) 04/22/2023   TRIG 103 04/22/2023   HDL 48 04/22/2023   CHOLHDL 5.8 04/22/2023   VLDL 21 04/22/2023   LDLCALC 207 (H) 04/22/2023   Physical Findings: AIMS:  , ,  ,  ,    CIWA:    COWS:     Musculoskeletal: Strength & Muscle Tone: within normal limits Gait & Station: normal Patient leans: N/A  Psychiatric Specialty Exam:  Presentation  General Appearance:  Casual; Disheveled  Eye Contact: Good  Speech: Clear and Coherent; Normal Rate  Speech Volume: Normal  Handedness: Right  Mood and Affect  Mood: Anxious; Depressed (but reports some improvement.)  Affect: Congruent  Thought Process  Thought Processes: Coherent; Goal Directed; Linear  Descriptions of Associations:Intact  Orientation:Full (Time, Place and Person)  Thought Content:Logical  History of Schizophrenia/Schizoaffective disorder:No  Duration of Psychotic Symptoms:No data recorded Hallucinations:Hallucinations: None  Ideas of Reference:None  Suicidal Thoughts:Suicidal Thoughts: No SI Active Intent and/or Plan: Without Intent; Without Plan; Without Means to Carry Out; Without Access to Means  Homicidal Thoughts:Homicidal Thoughts: No HI Passive Intent and/or Plan: Without Intent; Without Plan; Without Means to Carry Out; Without Access to Means  Sensorium  Memory: Immediate Good; Recent Good; Remote Good  Judgment: Fair  Insight: Good  Executive Functions  Concentration: Good  Attention Span: Good  Recall: Good  Fund of Knowledge: Fair  Language: Good  Psychomotor Activity  Psychomotor Activity:Psychomotor Activity: Normal  Assets  Assets: Communication Skills; Desire for Improvement; Resilience; Social Support  Sleep  Sleep:Sleep: Good Number of Hours of Sleep: 7.5  Physical  Exam: Physical Exam Vitals and nursing note reviewed.  HENT:     Nose: Nose normal.     Mouth/Throat:     Pharynx: Oropharynx is clear.  Eyes:     Pupils: Pupils are equal, round, and reactive to light.  Cardiovascular:     Rate and Rhythm: Normal rate.     Pulses: Normal pulses.  Pulmonary:     Effort: Pulmonary effort is normal.  Genitourinary:    Comments: Deferred Musculoskeletal:        General: Normal range of motion.     Cervical back: Normal range of motion.  Neurological:     General: No focal deficit present.     Mental Status: She is alert and oriented to person, place, and time.    Review of Systems  Constitutional:  Negative for chills, diaphoresis and fever.  HENT:  Negative for congestion and sore throat.   Eyes:  Negative for blurred vision.  Respiratory:  Negative for cough, shortness of breath and wheezing.   Cardiovascular:  Negative for chest pain and palpitations.  Gastrointestinal:  Negative for abdominal pain, constipation, diarrhea, heartburn, nausea and vomiting.  Musculoskeletal:  Negative for joint pain and myalgias.  Neurological:  Negative for dizziness, tingling, tremors, sensory change, speech change, focal weakness, seizures, loss of consciousness, weakness and headaches.  Psychiatric/Behavioral:  Positive  for depression, hallucinations and substance abuse (UDS (+) for THC). Negative for memory loss and suicidal ideas. The patient is not nervous/anxious and does not have insomnia.    Blood pressure (!) 143/86, pulse (!) 108, temperature 98 F (36.7 C), temperature source Oral, resp. rate 16, height 5\' 5"  (1.651 m), weight 97.6 kg, SpO2 100%, currently breastfeeding. Body mass index is 35.81 kg/m.  Treatment Plan Summary: Daily contact with patient to assess and evaluate symptoms and progress in treatment and Medication management.   Continue inpatient hospitalization.  Will continue today 04/25/2023 plan as below except where it is noted.     ASSESSMENT:   Principal Diagnosis: MDD (major depressive disorder), severe (HCC) Diagnosis:  Principal Problem:   MDD (major depressive disorder), severe (HCC) Active medical problems: Hypertension Diabetes mellitus type 2   PLAN: Safety and Monitoring:             -- Voluntary admission to inpatient psychiatric unit for safety, stabilization and treatment             -- Daily contact with patient to assess and evaluate symptoms and progress in treatment             -- Patient's case to be discussed in multi-disciplinary team meeting             -- Observation Level : q15 minute checks             -- Vital signs:  q12 hours             -- Precautions: suicide, elopement, and assault   2. Medications:              (At time of admission patient was started on Zoloft 25 mg daily, will titrate to 50 mg daily to address depression and anxiety, if well-tolerated would titrate to 100 mg in the next couple days).             Continue Zoloft 100 mg po daily for depression starting 04/25/23.             Continue Atarax 25 mg 3 times daily as needed for anxiety             Continue Trazodone 50 mg at bedtime as needed for sleep             Stop the clonidine 0.1 mg every 6 hours as needed for hypertension             Continue Norvasc 5 mg daily for hypertension, home medication but patient has not been compliant for the past few months, monitor blood pressure and address accordingly, patient reports has been on labetalol in the past in addition to Norvasc.             Continue Glucophage XR 500 mg twice daily for diabetes, home medication but patient has not been compliant for the past few months             Will obtain diabetes management consult to assist with diabetes control (patient has been evaluated by a registered dietitian with recommendation).             Semglee 12 units subcutaneous Q hs for DM-2.   --  The risks/benefits/side-effects/alternatives to this medication were discussed in  detail with the patient and time was given for questions. The patient consents to medication trial.  3. Labs Reviewed: CMP no significant abnormalities noted except for slightly low magnesium 1.6, lipid panel indicated elevated cholesterol 276 and elevated LDL 207, CBC noting low hemoglobin 10.9, low MCV low MCH low MCHC, slightly elevated platelets 450, hemoglobin A1c 10.4, pregnancy test negative, TSH within normal level, UA no UTI, urine drug screen positive for marijuana, EKG 8/5 normal sinus rhythm QTc 412       Lab ordered: Will obtain iron studies and monitor fingerstick blood sugar before meals and at bedtime   4.  Group and Therapy: -- Encouraged patient to participate in unit milieu and in scheduled group therapies              --Substance Use counseling: Patient was counseled regarding need to abstain from marijuana use after discharge, she agrees   5. Discharge Planning:              -- Social work and case management to assist with discharge planning and identification of hospital follow-up needs prior to discharge             -- Estimated LOS: 5-7 days             -- Discharge Concerns: Need to establish a safety plan; Medication compliance and effectiveness             -- Discharge Goals: Return home with outpatient referrals for mental health follow-up including medication management/psychotherapy   Armandina Stammer, NP, pmhnp, fnp-BC 04/25/2023, 3:41 PM Patient ID: Megan Ibarra, female   DOB: 1991-03-05, 32 y.o.   MRN: [id number]

## 2023-04-26 DIAGNOSIS — F322 Major depressive disorder, single episode, severe without psychotic features: Secondary | ICD-10-CM | POA: Diagnosis not present

## 2023-04-26 LAB — GLUCOSE, CAPILLARY
Glucose-Capillary: 150 mg/dL — ABNORMAL HIGH (ref 70–99)
Glucose-Capillary: 226 mg/dL — ABNORMAL HIGH (ref 70–99)

## 2023-04-26 MED ORDER — INSULIN GLARGINE-YFGN 100 UNIT/ML ~~LOC~~ SOLN: 12.0000 [IU] | Freq: Every day | SUBCUTANEOUS | 0 refills | Status: AC

## 2023-04-26 MED ORDER — SERTRALINE HCL 100 MG PO TABS: 100.0000 mg | ORAL_TABLET | Freq: Every day | ORAL | 0 refills | Status: DC

## 2023-04-26 MED ORDER — TRAZODONE HCL 50 MG PO TABS: 50.0000 mg | ORAL_TABLET | Freq: Every evening | ORAL | 0 refills | Status: AC | PRN

## 2023-04-26 MED ORDER — METFORMIN HCL ER 500 MG PO TB24: 500.0000 mg | ORAL_TABLET | Freq: Two times a day (BID) | ORAL | 0 refills | Status: AC

## 2023-04-26 MED ORDER — INSULIN ASPART 100 UNIT/ML IJ SOLN: 0.0000 [IU] | Freq: Three times a day (TID) | INTRAMUSCULAR | 0 refills | Status: AC

## 2023-04-26 MED ORDER — AMLODIPINE BESYLATE 5 MG PO TABS: 5.0000 mg | ORAL_TABLET | Freq: Every day | ORAL | 0 refills | Status: AC

## 2023-04-26 NOTE — Progress Notes (Signed)
   04/26/23 0726  Sleep (Behavioral Health Patients Only)  Documented sleep last 24 hours 6

## 2023-04-26 NOTE — Group Note (Signed)
Recreation Therapy Group Note   Group Topic:Problem Solving  Group Date: 04/26/2023 Start Time: 0931 End Time: 1001 Facilitators: Lion Fernandez-McCall, LRT,CTRS Location: [address] Dayroom   Goal Area(s) Addresses:  Patient will effectively work with peer towards shared goal.  Patient will identify skills used to make activity successful.  Patient will identify how skills used during activity can be applied to reach post d/c goals.   Group Description: [employer]. In teams of 5-6, patients were given 11 craft pipe cleaners. Using the materials provided, patients were instructed to compete again the opposing team(s) to build the tallest free-standing structure from floor level. The activity was timed; difficulty increased by Clinical research associate as Production designer, theatre/television/film continued.  Systematically resources were removed with additional directions for example, placing one arm behind their back, working in silence, and shape stipulations. LRT facilitated post-activity discussion reviewing team processes and necessary communication skills involved in completion. Patients were encouraged to reflect how the skills utilized, or not utilized, in this activity can be incorporated to positively impact support systems post discharge.   Affect/Mood: Appropriate   Participation Level: Engaged   Participation Quality: Independent   Behavior: Appropriate   Speech/Thought Process: Focused   Insight: Good   Judgement: Good   Modes of Intervention: Problem-solving   Patient Response to Interventions:  Engaged   Education Outcome:  Acknowledges education   Clinical Observations/Individualized Feedback: Pt attended and participated in group session. Pt worked well with peers in completing activity.    Plan: Continue to engage patient in RT group sessions 2-3x/week.   Fradel Baldonado-McCall, LRT,CTRS 04/26/2023 12:12 PM

## 2023-04-26 NOTE — Discharge Summary (Signed)
Physician Discharge Summary Note  Patient:  Megan Ibarra is an 32 y.o., female MRN:  [id number] DOB:  11-16-1990 Patient phone:  [phone] (home)  Patient address:   [address] [place] [address],   Total Time spent with patient:  Greater than 30 minutes  Date of Admission:  04/22/2023  Date of Discharge: 04-26-23  Reason for Admission: Worsening symptoms of depression & suicidal ideations .  Principal Problem: MDD (major depressive disorder), severe (HCC) Discharge Diagnoses: Principal Problem:   MDD (major depressive disorder), severe (HCC) Active Problems:   DM type 2 (diabetes mellitus, type 2) (HCC)   Hypertension  Past Psychiatric History: Major depressive disorders.  Past Medical History:  Past Medical History:  Diagnosis Date   Diabetes mellitus without complication (HCC)    Hypertension     Past Surgical History:  Procedure Laterality Date   NO PAST SURGERIES     Family History:  Family History  Problem Relation Age of Onset   Hypertension Mother    Diabetes Mother    Hypertension Maternal Grandmother    Diabetes Maternal Grandmother    Family Psychiatric  History: See H&P.  Social History:  Social History   Substance and Sexual Activity  Alcohol Use Yes   Comment: patient reports, "social drinker"     Social History   Substance and Sexual Activity  Drug Use Yes   Types: Marijuana    Social History   Socioeconomic History   Marital status: Single    Spouse name: Not on file   Number of children: Not on file   Years of education: Not on file   Highest education level: Not on file  Occupational History   Not on file  Tobacco Use   Smoking status: Former    Current packs/day: 0.00    Types: Cigarettes    Quit date: 07/02/2016    Years since quitting: 6.8   Smokeless tobacco: Never  Vaping Use   Vaping status: Never Used  Substance and Sexual Activity   Alcohol use: Yes    Comment: patient reports, "social  drinker"   Drug use: Yes    Types: Marijuana   Sexual activity: Yes    Birth control/protection: None  Other Topics Concern   Not on file  Social History Narrative   Not on file   Social Determinants of Health   Financial Resource Strain: Not on file  Food Insecurity: Food Insecurity Present (04/22/2023)   Hunger Vital Sign    Worried About Running Out of Food in the Last Year: Sometimes true    Ran Out of Food in the Last Year: Sometimes true  Transportation Needs: Unmet Transportation Needs (04/22/2023)   PRAPARE - Administrator, Civil Service (Medical): Yes    Lack of Transportation (Non-Medical): Yes  Physical Activity: Not on file  Stress: Not on file  Social Connections: Not on file   Hospital Course: (Per admission evaluation notes): 32 y.o., female with a past psychiatric history significant for anxiety disorder who presents to the [facility] from behavioral health urgent care for evaluation and management of worsening depression and SI. According to outside records, the patient reported herself to urgent care for increased depression over the past few months after stopping the medications for depression as well as her medical medications for hypertension and diabetes.  Although with hx of major depressive disorder while receiving treatment on an outpatient basis & cannabis use disorder, this is Megan Ibarra's first psychiatric admission/discharge  summary from this [facility]. She was admitted with complaint of worsening symptoms of depression & suicidal ideations. She cited as her triggers, the recent death of her grandmother, losing her job/transportation & having to move in with her mother due to financial difficulties. She was recommended for mood stabilization treatments by her treatment team after her admission evaluation. And with her consent she was treated, stabilized & discharged on the medications as listed below on her discharge medication lists. She was  also enrolled & participated in the group counseling sessions being offered & held on this unit. She learned coping skills. She also presented other significant pre-existing medical conditions that required treatment or monitoring. She was resumed & discharged on her pertinent home medications for those health issues. She was also evaluated by a registered dietitian for her diabetes & all the dietitian's recommendations acknowledged. Chassie tolerated her treatment regimen without any adverse effects or reactions reported.   Patient's symptoms responded well to her treatment team. However, patient did state that when coming into the inpatient hospitalization, that her expectation was to have an individual therapy sessions to help her learn how to cope with her grief & stressors. She expressed that group counseling session being offered here is not what she needs at this time. She also expressed that mood wise, that she is doing better. At this time, patient presents mentally & medically stable to be discharged to continue mental health care & counseling services on an outpatient basis as noted below. Patient is agreeable to this discharge.   During the course of this hospitalization, the 15-minute checks were adequate to ensure Cynithia's safety. Patient did not display any dangerous, violent or suicidal behavior on the unit.  She interacted with the other patients & staff appropriately. She participated appropriately in the group sessions/therapies. Her medications were addressed & adjusted to meet her needs. She was recommended for outpatient follow-up care & medication management upon discharge to assure her continuity of care.  At the time of discharge, patient is not reporting any acute suicidal/homicidal ideations. She feels more confident about herself & mental health care. She currently denies any new issues or concerns. Education and supportive counseling provided throughout her hospital stay & upon  discharge.   Today upon her discharge evaluation with her treatment team, [name] shares she is doing well. She denies any other specific concerns. She is sleeping well. Her appetite is good. She denies other physical complaints. She denies any AH/VH, delusional thoughts or paranoia. She does not appear to be responding to any internal stimuli. She feels that her medications have been helpful & is in agreement to continue her current treatment regimen as recommended. She was able to engage in safety planning including plan to return to [facility] or contact emergency services if she feels unable to maintain her own safety or the safety of others. Pt had no further questions, comments, or concerns. She left [facility] with all personal belongings in no apparent distress. Transportation per taxi cab. [facility] assisted with taxi fare.  Physical Findings: AIMS:  , ,  ,  ,    CIWA:    COWS:     Musculoskeletal: Strength & Muscle Tone: within normal limits Gait & Station: normal Patient leans: N/A  Psychiatric Specialty Exam:  Presentation  General Appearance:  Appropriate for Environment; Casual  Eye Contact: Good  Speech: Clear and Coherent; Normal Rate  Speech Volume: Normal  Handedness: Right  Mood and Affect  Mood: Euthymic  Affect: Appropriate; Congruent  Thought  Process  Thought Processes: Coherent; Goal Directed; Linear  Descriptions of Associations:Intact  Orientation:Full (Time, Place and Person)  Thought Content:Logical  History of Schizophrenia/Schizoaffective disorder:No  Duration of Psychotic Symptoms:No data recorded Hallucinations:Hallucinations: None  Ideas of Reference:None  Suicidal Thoughts:Suicidal Thoughts: No SI Active Intent and/or Plan: Without Intent; Without Plan  Homicidal Thoughts:Homicidal Thoughts: No HI Passive Intent and/or Plan: Without Intent; Without Plan; Without Means to Carry Out; Without Access to Means  Sensorium  Memory: Immediate Good;  Recent Good; Remote Good  Judgment: Fair  Insight: Fair  Executive Functions  Concentration: Good  Attention Span: Good  Recall: Good  Fund of Knowledge: Fair  Language: Good  Psychomotor Activity  Psychomotor Activity: Psychomotor Activity: Normal  Assets  Assets: Communication Skills; Desire for Improvement; Housing; Physical Health; Social Support  Sleep  Sleep: Sleep: Good Number of Hours of Sleep: 7.5  Physical Exam: Physical Exam Vitals and nursing note reviewed.  HENT:     Head: Normocephalic.     Nose: Nose normal.     Mouth/Throat:     Pharynx: Oropharynx is clear.  Eyes:     Pupils: Pupils are equal, round, and reactive to light.  Pulmonary:     Effort: Pulmonary effort is normal.  Genitourinary:    Comments: Deferred Musculoskeletal:        General: Normal range of motion.     Cervical back: Normal range of motion.  Skin:    General: Skin is warm and dry.  Neurological:     General: No focal deficit present.     Mental Status: She is alert and oriented to person, place, and time. Mental status is at baseline.    Review of Systems  Constitutional:  Negative for chills, diaphoresis and fever.  HENT:  Negative for congestion and sore throat.   Eyes:  Negative for blurred vision.  Respiratory:  Negative for cough, shortness of breath and wheezing.   Cardiovascular:  Negative for chest pain and palpitations.  Gastrointestinal:  Negative for abdominal pain, constipation, diarrhea, heartburn, nausea and vomiting.  Genitourinary:  Negative for dysuria.  Musculoskeletal:  Negative for joint pain and myalgias.  Neurological:  Negative for dizziness, tingling, tremors, sensory change, speech change, focal weakness, seizures, loss of consciousness, weakness and headaches.  Endo/Heme/Allergies:        Allergies: Latex  Psychiatric/Behavioral:  Positive for depression (Hx of (stable on medication).) and substance abuse (THC use). Negative for  hallucinations, memory loss and suicidal ideas. The patient is not nervous/anxious and does not have insomnia.    Blood pressure 135/84, pulse 96, temperature 97.9 F (36.6 C), temperature source Oral, resp. rate 16, height 5\' 5"  (1.651 m), weight 97.6 kg, SpO2 100%, currently breastfeeding. Body mass index is 35.81 kg/m.  Social History   Tobacco Use  Smoking Status Former   Current packs/day: 0.00   Types: Cigarettes   Quit date: 07/02/2016   Years since quitting: 6.8  Smokeless Tobacco Never   Tobacco Cessation:  N/A, patient does not currently use tobacco products  Blood Alcohol level:  Lab Results  Component Value Date   ETH <10 04/22/2023   Metabolic Disorder Labs:  Lab Results  Component Value Date   HGBA1C 10.4 (H) 04/22/2023   MPG 251.78 04/22/2023   No results found for: "PROLACTIN" Lab Results  Component Value Date   CHOL 276 (H) 04/22/2023   TRIG 103 04/22/2023   HDL 48 04/22/2023   CHOLHDL 5.8 04/22/2023   VLDL 21 04/22/2023   LDLCALC  207 (H) 04/22/2023   See Psychiatric Specialty Exam and Suicide Risk Assessment completed by Attending Physician prior to discharge.  Discharge destination:  Home  Is patient on multiple antipsychotic therapies at discharge:  No   Has Patient had three or more failed trials of antipsychotic monotherapy by history:  No  Recommended Plan for Multiple Antipsychotic Therapies: NA  Allergies as of 04/26/2023       Reactions   Latex Hives, Swelling, Rash, Other (See Comments)   Edema        Medication List     TAKE these medications      Indication  amLODipine 5 MG tablet Commonly known as: NORVASC Take 1 tablet (5 mg total) by mouth daily. For high blood pressure. What changed: additional instructions  Indication: High Blood Pressure Disorder   insulin aspart 100 UNIT/ML injection Commonly known as: novoLOG Inject 0-20 Units into the skin 3 (three) times daily with meals. For diabetes management  Indication:  Type 2 Diabetes   insulin glargine-yfgn 100 UNIT/ML injection Commonly known as: SEMGLEE Inject 0.12 mLs (12 Units total) into the skin at bedtime. For diabetes management.  Indication: Type 2 Diabetes   metFORMIN 500 MG 24 hr tablet Commonly known as: GLUCOPHAGE-XR Take 1 tablet (500 mg total) by mouth 2 (two) times daily with a meal. For diabetes management. What changed: additional instructions  Indication: Type 2 Diabetes   sertraline 100 MG tablet Commonly known as: ZOLOFT Take 1 tablet (100 mg total) by mouth daily. For depression Start taking on: April 27, 2023 What changed:  medication strength how much to take additional instructions  Indication: Major Depressive Disorder   traZODone 50 MG tablet Commonly known as: DESYREL Take 1 tablet (50 mg total) by mouth at bedtime as needed for sleep.  Indication: Trouble Sleeping        Follow-up Information     [facility]. Go to.   Specialty: Behavioral Health Why: Please go to this provider for an assessment, to obtain therapy and medication management services.  Please go Monday through Friday, arrive by 7:00 am for same day services. Contact information: [address] [place] [address] [phone]               Follow-up recommendations: Activity:  As tolerated Diet: As recommended by your primary care doctor. Keep all scheduled follow-up appointments as recommended.   Comments: Comments: Patient is recommended to follow-up care on an outpatient basis as noted above. Prescriptions sent to pt's pharmacy of choice at discharge.   Patient agreeable to plan.   Given opportunity to ask questions.   Appears to feel comfortable with discharge denies any current suicidal or homicidal thought. Patient is also instructed prior to discharge to: Take all medications as prescribed by his/her mental healthcare provider. Report any adverse effects and or reactions from the  medicines to his/her outpatient provider promptly. Patient has been instructed & cautioned: To not engage in alcohol and or illegal drug use while on prescription medicines. In the event of worsening symptoms, patient is instructed to call the crisis hotline, 911 and or go to the nearest ED for appropriate evaluation and treatment of symptoms. To follow-up with his/her primary care provider for your other medical issues, concerns and or health care needs.  Signed: Armandina Stammer, NP, pmhnp, fnp-bc. 04/26/2023, 9:15 AM

## 2023-04-26 NOTE — Progress Notes (Signed)
  [facility] Adult Case Management Discharge Plan :  Will you be returning to the same living situation after discharge:  Yes,   mother Orrin Brigham [phone] At discharge, do you have transportation home?: No.Taxi Do you have the ability to pay for your medications: Yes,  Insured  Release of information consent forms completed and in the chart;  Patient's signature needed at discharge.  Patient to Follow up at:  Follow-up Information     [facility]. Go to.   Specialty: Behavioral Health Why: Please go to this provider for an assessment, to obtain therapy and medication management services.  Please go Monday through Friday, arrive by 7:00 am for same day services. Contact information: [address] [place] [address] [phone]                Next level of care provider has access to [facility] Link:yes  Safety Planning and Suicide Prevention discussed: Yes,   mother Orrin Brigham [phone]     Has patient been referred to the Quitline?: Patient refused referral for treatment  Patient has been referred for addiction treatment: Yes, the patient will follow up with an outpatient provider for substance use disorder. Psychiatrist/APP: patient to schedule appointment and Therapist: patient to schedule appointment Patient to continue working towards treatment goals after discharge. Patient no longer meets criteria for inpatient criteria per attending physician. Continue taking medications as prescribed, nursing to provide instructions at discharge. Follow up with all scheduled appointments.   Essance Gatti S Symon Norwood, LCSW 04/26/2023, 9:55 AM

## 2023-04-26 NOTE — [facility] Suicide Risk Assessment (Signed)
Suicide Risk Assessment  Discharge Assessment    [facility] Discharge Suicide Risk Assessment   Principal Problem: MDD (major depressive disorder), severe (HCC)  Discharge Diagnoses: Principal Problem:   MDD (major depressive disorder), severe (HCC) Active Problems:   DM type 2 (diabetes mellitus, type 2) (HCC)   Hypertension  Total Time spent with patient:  Greater than 30 minutes  Musculoskeletal: Strength & Muscle Tone: within normal limits Gait & Station: normal Patient leans: N/A  Psychiatric Specialty Exam  Presentation  General Appearance:  Appropriate for Environment; Casual  Eye Contact: Good  Speech: Clear and Coherent; Normal Rate  Speech Volume: Normal  Handedness: Right   Mood and Affect  Mood: Euthymic  Duration of Depression Symptoms: Greater than two weeks  Affect: Appropriate; Congruent   Thought Process  Thought Processes: Coherent; Goal Directed; Linear  Descriptions of Associations:Intact  Orientation:Full (Time, Place and Person)  Thought Content:Logical  History of Schizophrenia/Schizoaffective disorder:No  Duration of Psychotic Symptoms:No data recorded Hallucinations:Hallucinations: None  Ideas of Reference:None  Suicidal Thoughts:Suicidal Thoughts: No SI Active Intent and/or Plan: Without Intent; Without Plan  Homicidal Thoughts:Homicidal Thoughts: No HI Passive Intent and/or Plan: Without Intent; Without Plan; Without Means to Carry Out; Without Access to Means   Sensorium  Memory: Immediate Good; Recent Good; Remote Good  Judgment: Fair  Insight: Fair   Executive Functions  Concentration: Good  Attention Span: Good  Recall: [place] Major of Knowledge: Fair  Language: Good   Psychomotor Activity  Psychomotor Activity:Psychomotor Activity: Normal   Assets  Assets: Communication Skills; Desire for Improvement; Housing; Physical Health; Social Support   Sleep  Sleep:Sleep: Good Number of  Hours of Sleep: 7.5   Physical Exam: See H&P. Blood pressure 135/84, pulse 96, temperature 97.9 F (36.6 C), temperature source Oral, resp. rate 16, height 5\' 5"  (1.651 m), weight 97.6 kg, SpO2 100%, currently breastfeeding. Body mass index is 35.81 kg/m.  Mental Status Per Nursing Assessment::   On Admission:  Suicidal ideation indicated by patient, Thoughts of violence towards others  Demographic Factors:  Adolescent or young adult, Low socioeconomic status, and Unemployed  Loss Factors: Decrease in vocational status, Loss of significant relationship, and Financial problems/change in socioeconomic status  Historical Factors: NA  Risk Reduction Factors:   Responsible for children under 63 years of age, Sense of responsibility to family, Living with another person, especially a relative, Positive social support, Positive therapeutic relationship, and Positive coping skills or problem solving skills  Continued Clinical Symptoms:  Depression:   Comorbid alcohol abuse/dependence Alcohol/Substance Abuse/Dependencies More than one psychiatric diagnosis Previous Psychiatric Diagnoses and Treatments Medical Diagnoses and Treatments/Surgeries  Cognitive Features That Contribute To Risk:  Closed-mindedness, Polarized thinking, and Thought constriction (tunnel vision)    Suicide Risk:  Minimal: No identifiable suicidal ideation.  Patients presenting with no risk factors but with morbid ruminations; may be classified as minimal risk based on the severity of the depressive symptoms   Follow-up Information     [facility]. Go to.   Specialty: Behavioral Health Why: Please go to this provider for an assessment, to obtain therapy and medication management services.  Please go Monday through Friday, arrive by 7:00 am for same day services. Contact information: [address] [place] [address] [phone]               Plan Of  Care/Follow-up recommendations:  See the discharge recommendations above.  Armandina Stammer, NP, pmhnp, fnp-bc 04/26/2023, 8:52 AM

## 2023-04-26 NOTE — Progress Notes (Signed)
   04/25/23 2125  Psych Admission Type (Psych Patients Only)  Admission Status Voluntary/72 hour document signed  Psychosocial Assessment  Patient Complaints None  Eye Contact Fair  Facial Expression Worried  Affect Appropriate to circumstance  Speech Logical/coherent  Interaction Assertive  Motor Activity Other (Comment) (WDL)  Appearance/Hygiene Unremarkable  Behavior Characteristics Cooperative  Mood Pleasant  Thought Process  Coherency WDL  Content WDL  Delusions None reported or observed  Perception WDL  Hallucination None reported or observed  Judgment WDL  Confusion None  Danger to Self  Current suicidal ideation? Denies  Danger to Others  Danger to Others None reported or observed  Danger to Others Abnormal  Harmful Behavior to others No threats or harm toward other people

## 2023-07-26 ENCOUNTER — Other Ambulatory Visit: Payer: Self-pay | Admitting: Family Medicine

## 2023-07-26 DIAGNOSIS — E010 Iodine-deficiency related diffuse (endemic) goiter: Secondary | ICD-10-CM

## 2023-07-31 ENCOUNTER — Inpatient Hospital Stay: Admission: RE | Admit: 2023-07-31 | Payer: Medicaid Other | Source: Ambulatory Visit

## 2023-08-09 ENCOUNTER — Other Ambulatory Visit: Payer: Medicaid Other

## 2024-01-30 ENCOUNTER — Encounter (HOSPITAL_COMMUNITY): Payer: Self-pay | Admitting: *Deleted

## 2024-01-30 ENCOUNTER — Ambulatory Visit (HOSPITAL_COMMUNITY)
Admission: EM | Admit: 2024-01-30 | Discharge: 2024-01-30 | Disposition: A | Discharge status: Home / Self Care | Attending: Emergency Medicine | Admitting: Emergency Medicine

## 2024-01-30 ENCOUNTER — Other Ambulatory Visit: Payer: Self-pay

## 2024-01-30 DIAGNOSIS — Z23 Encounter for immunization: Secondary | ICD-10-CM

## 2024-01-30 DIAGNOSIS — S91331A Puncture wound without foreign body, right foot, initial encounter: Secondary | ICD-10-CM

## 2024-01-30 MED ORDER — AMOXICILLIN-POT CLAVULANATE 875-125 MG PO TABS: 1.0000 | ORAL_TABLET | Freq: Two times a day (BID) | ORAL | 0 refills | Status: AC

## 2024-01-30 MED ORDER — TETANUS-DIPHTH-ACELL PERTUSSIS 5-2.5-18.5 LF-MCG/0.5 IM SUSY
0.5000 mL | PREFILLED_SYRINGE | Freq: Once | INTRAMUSCULAR | Status: AC
  Administered 2024-01-30: 0.5 mL via INTRAMUSCULAR

## 2024-01-30 MED ORDER — MUPIROCIN CALCIUM 2 % EX CREA: 1.0000 | TOPICAL_CREAM | Freq: Two times a day (BID) | CUTANEOUS | 0 refills | Status: AC

## 2024-01-30 MED ORDER — TETANUS-DIPHTH-ACELL PERTUSSIS 5-2.5-18.5 LF-MCG/0.5 IM SUSY
PREFILLED_SYRINGE | INTRAMUSCULAR | Status: AC
  Filled 2024-01-30: qty 0.5

## 2024-01-30 NOTE — ED Triage Notes (Signed)
 PT stepped on a roofing nail this morning. PT reports she needs a tetanus vaccine.

## 2024-01-30 NOTE — ED Provider Notes (Signed)
 [facility]    CSN: [id number] Arrival date & time: 01/30/24  1726      History   Chief Complaint Chief Complaint  Patient presents with   Foot Injury    HPI Megan Ibarra is a 33 y.o. female.   Patient presents with puncture wound to right foot after stepping on a roofing nail earlier today.  Patient states that the nail went through her sandal and poked her foot.  Patient is unsure of last tetanus booster.  Patient endorses pain to her foot.  Patient reports she has been taking ibuprofen  with minimal relief of pain.  The history is provided by the patient and medical records.  Foot Injury   Past Medical History:  Diagnosis Date   Diabetes mellitus without complication (HCC)    Hypertension     Patient Active Problem List   Diagnosis Date Noted   Hypertension 04/23/2023   MDD (major depressive disorder), severe (HCC) 04/22/2023   SVD (spontaneous vaginal delivery) 06/08/2017   Laceration of vaginal wall or sulcus without perineal laceration during delivery 06/08/2017   Chronic hypertension in pregnancy 06/08/2017   Normal labor 06/07/2017   DM type 2 (diabetes mellitus, type 2) (HCC) 11/30/2016    Past Surgical History:  Procedure Laterality Date   NO PAST SURGERIES      OB History     Gravida  1   Para  1   Term  1   Preterm      AB      Living  1      SAB      IAB      Ectopic      Multiple  0   Live Births  1            Home Medications    Prior to Admission medications   Medication Sig Start Date End Date Taking? Authorizing Provider  amoxicillin -clavulanate (AUGMENTIN ) 875-125 MG tablet Take 1 tablet by mouth every 12 (twelve) hours. 01/30/24  Yes Rosevelt Constable, Garl Speigner A, NP  mupirocin cream (BACTROBAN) 2 % Apply 1 Application topically 2 (two) times daily. 01/30/24  Yes Rosevelt Constable, Duong Haydel A, NP  amLODipine  (NORVASC ) 5 MG tablet Take 1 tablet (5 mg total) by mouth daily. For high blood pressure. 04/26/23   Asuncion Layer  I, NP  insulin  aspart (NOVOLOG ) 100 UNIT/ML injection Inject 0-20 Units into the skin 3 (three) times daily with meals. For diabetes management 04/26/23   Asuncion Layer I, NP  insulin  glargine-yfgn (SEMGLEE ) 100 UNIT/ML injection Inject 0.12 mLs (12 Units total) into the skin at bedtime. For diabetes management. 04/26/23   Asuncion Layer I, NP  metFORMIN  (GLUCOPHAGE -XR) 500 MG 24 hr tablet Take 1 tablet (500 mg total) by mouth 2 (two) times daily with a meal. For diabetes management. 04/26/23   Asuncion Layer I, NP  traZODone  (DESYREL ) 50 MG tablet Take 1 tablet (50 mg total) by mouth at bedtime as needed for sleep. 04/26/23   Donnis Galeazzi, NP    Family History Family History  Problem Relation Age of Onset   Hypertension Mother    Diabetes Mother    Hypertension Maternal Grandmother    Diabetes Maternal Grandmother     Social History Social History   Tobacco Use   Smoking status: Former    Current packs/day: 0.00    Types: Cigarettes    Quit date: 07/02/2016    Years since quitting: 7.5   Smokeless tobacco: Never  Vaping Use  Vaping status: Never Used  Substance Use Topics   Alcohol use: Yes    Comment: patient reports, "social drinker"   Drug use: Yes    Types: Marijuana     Allergies   Latex   Review of Systems Review of Systems  Per HPI  Physical Exam Triage Vital Signs ED Triage Vitals  Encounter Vitals Group     BP 01/30/24 1804 (!) 152/81     Systolic BP Percentile --      Diastolic BP Percentile --      Pulse Rate 01/30/24 1804 90     Resp 01/30/24 1804 20     Temp 01/30/24 1804 98.1 F (36.7 C)     Temp src --      SpO2 01/30/24 1804 98 %     Weight --      Height --      Head Circumference --      Peak Flow --      Pain Score 01/30/24 1800 10     Pain Loc --      Pain Education --      Exclude from Growth Chart --    No data found.  Updated Vital Signs BP (!) 152/81   Pulse 90   Temp 98.1 F (36.7 C)   Resp 20   LMP 01/11/2024   SpO2 98%    Visual Acuity Right Eye Distance:   Left Eye Distance:   Bilateral Distance:    Right Eye Near:   Left Eye Near:    Bilateral Near:     Physical Exam Vitals and nursing note reviewed.  Constitutional:      General: She is awake. She is not in acute distress.    Appearance: Normal appearance. She is well-developed and well-groomed. She is not ill-appearing.  Skin:    General: Skin is warm and dry.     Findings: Wound present.     Comments: Puncture wound noted to plantar aspect of right foot just below her great toe.  Bleeding controlled at this time.  Neurological:     Mental Status: She is alert.  Psychiatric:        Behavior: Behavior is cooperative.      UC Treatments / Results  Labs (all labs ordered are listed, but only abnormal results are displayed) Labs Reviewed - No data to display  EKG   Radiology No results found.  Procedures Procedures (including critical care time)  Medications Ordered in UC Medications  Tdap (BOOSTRIX) injection 0.5 mL (0.5 mLs Intramuscular Given 01/30/24 1901)    Initial Impression / Assessment and Plan / UC Course  I have reviewed the triage vital signs and the nursing notes.  Pertinent labs & imaging results that were available during my care of the patient were reviewed by me and considered in my medical decision making (see chart for details).     Patient is well-appearing.  Vitals are stable.  Puncture wound noted to the plantar aspect of the right foot just below her great toe.  Bleeding controlled at this time.  Updated tetanus booster in clinic.  Provided wound care in clinic.  Prescribed Augmentin  for infection coverage.  Prescribed mupirocin for additional infection prevention.  Discussed proper wound care.  Discussed strict return precautions. Final Clinical Impressions(s) / UC Diagnoses   Final diagnoses:  Puncture wound of right foot, initial encounter     Discharge Instructions      We have updated your  tetanus booster today.  Start taking Augmentin  twice daily for 7 days for infection coverage. Apply mupirocin cream twice daily to the area for further infection prevention. Keep the area clean dry and covered. Otherwise you can alternate between 650 mg of Tylenol  and 400 mg of ibuprofen  every 6-8 hours as needed for pain. Return here if you notice any worsening pain, swelling, redness, or loss of puslike drainage from the area.  ED Prescriptions     Medication Sig Dispense Auth. Provider   amoxicillin -clavulanate (AUGMENTIN ) 875-125 MG tablet Take 1 tablet by mouth every 12 (twelve) hours. 14 tablet Rosevelt Constable, Lakeya Mulka A, NP   mupirocin cream (BACTROBAN) 2 % Apply 1 Application topically 2 (two) times daily. 15 g Levora Reas A, NP      PDMP not reviewed this encounter.   Levora Reas A, NP 01/30/24 2030

## 2024-01-30 NOTE — Discharge Instructions (Addendum)
 We have updated your tetanus booster today. Start taking Augmentin  twice daily for 7 days for infection coverage. Apply mupirocin cream twice daily to the area for further infection prevention. Keep the area clean dry and covered. Otherwise you can alternate between 650 mg of Tylenol  and 400 mg of ibuprofen  every 6-8 hours as needed for pain. Return here if you notice any worsening pain, swelling, redness, or loss of puslike drainage from the area.
